# Patient Record
Sex: Female | Born: 1950 | Race: White | Hispanic: No | Marital: Married | Smoking: Former smoker
Health system: Southern US, Community
[De-identification: ages and names within clinical notes are randomized; demographics above are authoritative.]

## PROBLEM LIST (undated history)

## (undated) DIAGNOSIS — R519 Headache, unspecified: Secondary | ICD-10-CM

## (undated) DIAGNOSIS — M1990 Unspecified osteoarthritis, unspecified site: Secondary | ICD-10-CM

## (undated) DIAGNOSIS — R3915 Urgency of urination: Secondary | ICD-10-CM

## (undated) DIAGNOSIS — R51 Headache: Secondary | ICD-10-CM

## (undated) DIAGNOSIS — M810 Age-related osteoporosis without current pathological fracture: Secondary | ICD-10-CM

## (undated) DIAGNOSIS — R251 Tremor, unspecified: Secondary | ICD-10-CM

## (undated) DIAGNOSIS — J309 Allergic rhinitis, unspecified: Secondary | ICD-10-CM

## (undated) HISTORY — PX: NO PAST SURGERIES: SHX2092

---

## 2001-11-13 ENCOUNTER — Encounter: Admission: RE | Admit: 2001-11-13 | Discharge: 2001-11-13 | Payer: Self-pay | Admitting: Emergency Medicine

## 2001-11-13 ENCOUNTER — Encounter: Payer: Self-pay | Admitting: Emergency Medicine

## 2003-01-08 ENCOUNTER — Encounter: Admission: RE | Admit: 2003-01-08 | Discharge: 2003-01-08 | Payer: Self-pay | Admitting: Emergency Medicine

## 2003-03-14 ENCOUNTER — Other Ambulatory Visit: Admission: RE | Admit: 2003-03-14 | Discharge: 2003-03-14 | Payer: Self-pay | Admitting: Emergency Medicine

## 2004-02-19 ENCOUNTER — Encounter: Admission: RE | Admit: 2004-02-19 | Discharge: 2004-02-19 | Payer: Self-pay | Admitting: Emergency Medicine

## 2004-03-15 ENCOUNTER — Other Ambulatory Visit: Admission: RE | Admit: 2004-03-15 | Discharge: 2004-03-15 | Payer: Self-pay | Admitting: Emergency Medicine

## 2005-05-18 ENCOUNTER — Encounter: Admission: RE | Admit: 2005-05-18 | Discharge: 2005-05-18 | Payer: Self-pay | Admitting: Emergency Medicine

## 2005-05-27 ENCOUNTER — Other Ambulatory Visit: Admission: RE | Admit: 2005-05-27 | Discharge: 2005-05-27 | Payer: Self-pay | Admitting: Emergency Medicine

## 2006-05-25 ENCOUNTER — Encounter: Admission: RE | Admit: 2006-05-25 | Discharge: 2006-05-25 | Payer: Self-pay | Admitting: Family Medicine

## 2007-06-01 ENCOUNTER — Encounter: Admission: RE | Admit: 2007-06-01 | Discharge: 2007-06-01 | Payer: Self-pay | Admitting: Family Medicine

## 2008-06-02 ENCOUNTER — Encounter: Admission: RE | Admit: 2008-06-02 | Discharge: 2008-06-02 | Payer: Self-pay | Admitting: Family Medicine

## 2009-05-31 ENCOUNTER — Emergency Department (HOSPITAL_COMMUNITY): Admission: EM | Admit: 2009-05-31 | Discharge: 2009-05-31 | Payer: Self-pay | Admitting: Emergency Medicine

## 2009-06-23 ENCOUNTER — Encounter: Admission: RE | Admit: 2009-06-23 | Discharge: 2009-06-23 | Payer: Self-pay | Admitting: Family Medicine

## 2011-07-06 ENCOUNTER — Other Ambulatory Visit: Payer: Self-pay | Admitting: Family Medicine

## 2011-07-06 DIAGNOSIS — Z1231 Encounter for screening mammogram for malignant neoplasm of breast: Secondary | ICD-10-CM

## 2011-07-06 DIAGNOSIS — M8580 Other specified disorders of bone density and structure, unspecified site: Secondary | ICD-10-CM

## 2011-07-28 ENCOUNTER — Ambulatory Visit: Payer: Self-pay

## 2011-07-28 ENCOUNTER — Ambulatory Visit
Admission: RE | Admit: 2011-07-28 | Discharge: 2011-07-28 | Disposition: A | Discharge status: Home / Self Care | Payer: 59 | Source: Ambulatory Visit | Attending: Family Medicine | Admitting: Family Medicine

## 2011-07-28 ENCOUNTER — Other Ambulatory Visit: Payer: Self-pay

## 2011-07-28 DIAGNOSIS — M8580 Other specified disorders of bone density and structure, unspecified site: Secondary | ICD-10-CM

## 2012-09-04 ENCOUNTER — Other Ambulatory Visit: Payer: Self-pay | Admitting: Family Medicine

## 2012-09-04 DIAGNOSIS — Z1231 Encounter for screening mammogram for malignant neoplasm of breast: Secondary | ICD-10-CM

## 2012-09-25 ENCOUNTER — Inpatient Hospital Stay: Admission: RE | Admit: 2012-09-25 | Payer: 59 | Source: Ambulatory Visit

## 2014-04-28 ENCOUNTER — Encounter: Payer: Self-pay | Admitting: Physical Therapy

## 2014-04-28 ENCOUNTER — Ambulatory Visit: Payer: 59 | Attending: Physician Assistant | Admitting: Physical Therapy

## 2014-04-28 DIAGNOSIS — M5440 Lumbago with sciatica, unspecified side: Secondary | ICD-10-CM

## 2014-04-28 DIAGNOSIS — R29898 Other symptoms and signs involving the musculoskeletal system: Secondary | ICD-10-CM | POA: Diagnosis not present

## 2014-04-28 DIAGNOSIS — R200 Anesthesia of skin: Secondary | ICD-10-CM | POA: Insufficient documentation

## 2014-04-28 NOTE — Therapy (Signed)
[facility] [address], [address] [place], [place], [address] Phone: [phone]   Fax:  [phone]  Physical Therapy Evaluation  Patient Details  Name: Wendy Clark MRN: [id number] Date of Birth: 1950/07/13 Referring Provider:  Jarrett Soho, PA-C  Encounter Date: 04/28/2014      PT End of Session - 04/28/14 1114    Visit Number 1   Date for PT Re-Evaluation 06/09/14   PT Start Time 1100   PT Stop Time 1145   PT Time Calculation (min) 45 min   Activity Tolerance Patient tolerated treatment well   Behavior During Therapy [facility] for tasks assessed/performed      History reviewed. No pertinent past medical history.  History reviewed. No pertinent past surgical history.  There were no vitals filed for this visit.  Visit Diagnosis:  Back pain of lumbar region with sciatica - Plan: PT plan of care cert/re-cert      Subjective Assessment - 04/28/14 1107    Subjective Patient reports back pain that started on 04/07/2014.  Patient reports she was using a garden tool when she hurt her back then sat for several hours. Patient reports pain readiating into left leg into foot.  Patient reports she saw a chiropractor  2 times and not now.  [place] and return on 4/28,.   Limitations Sitting   How long can you sit comfortably? sit for 30 min. and depends on chair and amount of cushion in chair.    How long can you stand comfortably? No difficulty   How long can you walk comfortably? No difficulty   Diagnostic tests x-ray showed lumbar vertebrae were almost fused   Patient Stated Goals learn ways to manage pain and correct body mechanics.    Currently in Pain? Yes   Pain Score 4   low pain 1/10   Pain Location Back   Pain Orientation Left   Pain Type Chronic pain   Pain Radiating Towards down left leg   Pain Onset 1 to 4 weeks ago   Pain Frequency Constant   Aggravating Factors  sitting, sleeping   Pain Relieving Factors ice, change body position   Effect of Pain on Daily Activities difficulty with gardening   Multiple Pain Sites Yes   Pain Location Foot   Pain Orientation Left   Pain Descriptors / Indicators Numbness   Pain Type Chronic pain   Pain Onset 1 to 4 weeks ago   Pain Frequency Intermittent   Aggravating Factors  Not sure            [facility] PT Assessment - 04/28/14 0001    Assessment   Medical Diagnosis low back pain   Onset Date 04/07/14   Prior Therapy chiropractor   Precautions   Precautions None   Balance Screen   Has the patient fallen in the past 6 months Yes  while doing yoga   How many times? 1   Has the patient had a decrease in activity level because of a fear of falling?  No   Is the patient reluctant to leave their home because of a fear of falling?  No   Prior Function   Level of Independence Independent with basic ADLs   Observation/Other Assessments   Focus on Therapeutic Outcomes (FOTO)  41% limitation   Posture/Postural Control   Posture/Postural Control Postural limitations   Postural Limitations Rounded Shoulders;Forward head;Decreased lumbar lordosis   ROM /  Strength   AROM / PROM / Strength Strength   AROM   Lumbar Flexion full   Lumbar Extension decreased by 50%   Lumbar - Right Side Bend full   Lumbar - Left Side Bend full   Strength   Left Hip Flexion 4/5   Left Hip Extension 4/5   Left Hip ABduction 3/5   Left Knee Flexion 4/5   Left Knee Extension 4/5   Left Ankle Dorsiflexion 4/5   Left Ankle Eversion 4/5   Flexibility   Hamstrings tight   Piriformis tight   Obturator Internus tight   Palpation   Palpation palpable tenderness located in left lumbar paraspinal, left piriformis, left hamstring   Straight Leg Raise   Findings Negative   Side  Left                           PT Education - 04/28/14 1138    Education provided Yes   Education Details body  mechanics with daily tasks   Person(s) Educated Patient   Methods Explanation;Verbal cues;Handout   Comprehension Verbalized understanding          PT Short Term Goals - 04/28/14 1128    PT SHORT TERM GOAL #1   Title undertand correct body mechanics for traveling, lifting, sitting and standing   Time 3   Period Weeks   Status New   PT SHORT TERM GOAL #2   Title understand initial HEP to assist in pain management for the trip.    Time 3   Period Weeks   Status New           PT Long Term Goals - 04/28/14 1130    PT LONG TERM GOAL #1   Title sit comfortable to 1 hour with pain decreased >/= 75%   Time 6   Period Weeks   Status New   PT LONG TERM GOAL #2   Title increase strength in left leg so she can tolerate her daily tasks with greater ease   Time 6   Period Weeks   Status New   PT LONG TERM GOAL #3   Title understand correct body mechaincs with gardening and ablility to perform with >/= 65% greater ease   Time 6   Period Weeks   Status New               Plan - 04/28/14 1138    Clinical Impression Statement Patient has back pain that radiates into the left lower extremity.  Patient has numbness in right foot. Patient has weakness in left lower extremity and trunk.    Pt will benefit from skilled therapeutic intervention in order to improve on the following deficits Improper body mechanics;Decreased endurance;Decreased activity tolerance;Increased muscle spasms;Pain;Decreased mobility;Decreased strength   Rehab Potential Excellent   PT Frequency 3x / week   PT Duration 6 weeks  Patient will be out of town from 4/18-4/28   PT Treatment/Interventions ADLs/Self Care Home Management;Moist Heat;Therapeutic activities;Patient/family education;Therapeutic exercise;Traction;Ultrasound;Manual techniques;Neuromuscular re-education;Cryotherapy;Electrical Stimulation;Functional mobility training   PT Next Visit Plan back stabilization exercises, modalities as needed,  soft tissue work in left piriformis, body mechanics   PT Home Exercise Plan body mechainics for lifting   Recommended Other Services None   Consulted and Agree with Plan of Care Patient         Problem List There are no active problems to display for this patient.   GRAY,CHERYL,PT 04/28/2014, 11:46 AM  [facility] [address] [place], [place], [address] Phone: [phone]   Fax:  [phone]

## 2014-04-28 NOTE — Patient Instructions (Signed)
Posture - Sitting   Sit upright, head facing forward. Try using a roll to support lower back. Keep shoulders relaxed, and avoid rounded back. Keep hips level with knees. Avoid crossing legs for long periods.   Copyright  VHI. All rights reserved.  Standing   For prolonged standing, alternate placing one foot in front of the other or on a stool. Wear low-heeled shoes, and maintain good posture.   Copyright  VHI. All rights reserved.  Sleeping on Back   Place pillow under knees. A pillow with cervical support and a roll around waist are also helpful.   Copyright  VHI. All rights reserved.  Lifting Principles .Maintain proper posture and head alignment. .Slide object as close as possible before lifting. .Move obstacles out of the way. .Test before lifting; ask for help if too heavy. .Tighten stomach muscles without holding breath. .Use smooth movements; do not jerk. .Use legs to do the work, and pivot with feet. .Distribute the work load symmetrically and close to the center of trunk. .Push instead of pull whenever possible.

## 2014-04-30 ENCOUNTER — Encounter: Payer: Self-pay | Admitting: Physical Therapy

## 2014-04-30 ENCOUNTER — Ambulatory Visit: Payer: 59 | Admitting: Physical Therapy

## 2014-04-30 DIAGNOSIS — M5440 Lumbago with sciatica, unspecified side: Secondary | ICD-10-CM

## 2014-04-30 NOTE — Therapy (Signed)
[facility] [address], [address] [place], [place], [address] Phone: [phone]   Fax:  [phone]  Physical Therapy Treatment  Patient Details  Name: Wendy Clark MRN: [id number] Date of Birth: 07/29/1950 Referring Provider:  Marcelo Baldy, PA-C  Encounter Date: 04/30/2014      PT End of Session - 04/30/14 1516    Visit Number 2   Date for PT Re-Evaluation 06/09/14   PT Start Time 1445   PT Stop Time 1530   PT Time Calculation (min) 45 min   Activity Tolerance Patient tolerated treatment well   Behavior During Therapy [facility] for tasks assessed/performed      History reviewed. No pertinent past medical history.  History reviewed. No pertinent past surgical history.  There were no vitals filed for this visit.  Visit Diagnosis:  Back pain of lumbar region with sciatica      Subjective Assessment - 04/30/14 1450    Subjective No changes after the evaluation.    Limitations Sitting   How long can you sit comfortably? sit for 30 min. and depends on chair and amount of cushion in chair.    How long can you stand comfortably? No difficulty   How long can you walk comfortably? No difficulty   Diagnostic tests x-ray showed lumbar vertebrae were almost fused   Patient Stated Goals learn ways to manage pain and correct body mechanics.    Currently in Pain? Yes   Pain Score 3    Pain Location Back   Pain Orientation Left   Pain Descriptors / Indicators Aching   Pain Type Chronic pain   Pain Radiating Towards down left buttock and left leg   Pain Onset 1 to 4 weeks ago   Pain Frequency Constant   Aggravating Factors  sitting and sleeping   Pain Relieving Factors ice, change body position   Effect of Pain on Daily Activities difficutly wiht gardening   Multiple Pain Sites No                       [facility] Adult PT Treatment/Exercise - 04/30/14 0001    Modalities   Modalities Ultrasound   Ultrasound    Ultrasound Location Left buttocks   Ultrasound Parameters 100%, 1 mhz, 1.2 w/cm2, 8 min   Ultrasound Goals Pain   Manual Therapy   Manual Therapy Massage   Massage Deep soft tissue work to left gluteal, piriformis, and SI area                PT Education - 04/30/14 1524    Education provided Yes   Education Details piriformis stretch, how to use tennis ball for trigger point massage to left gluteal, neural tension stretch with slumped spine   Person(s) Educated Patient   Methods Explanation;Demonstration;Tactile cues;Verbal cues;Handout   Comprehension Returned demonstration;Verbalized understanding          PT Short Term Goals - 04/28/14 1128    PT SHORT TERM GOAL #1   Title undertand correct body mechanics for traveling, lifting, sitting and standing   Time 3   Period Weeks   Status New   PT SHORT TERM GOAL #2   Title understand initial HEP to assist in pain management for the trip.    Time 3   Period Weeks   Status New           PT Long Term Goals - 04/28/14 1130    PT LONG TERM GOAL #  1   Title sit comfortable to 1 hour with pain decreased >/= 75%   Time 6   Period Weeks   Status New   PT LONG TERM GOAL #2   Title increase strength in left leg so she can tolerate her daily tasks with greater ease   Time 6   Period Weeks   Status New   PT LONG TERM GOAL #3   Title understand correct body mechaincs with gardening and ablility to perform with >/= 65% greater ease   Time 6   Period Weeks   Status New               Plan - 04/30/14 1522    Clinical Impression Statement Patient has muscle spasm in left gluteal and piriformis.     Pt will benefit from skilled therapeutic intervention in order to improve on the following deficits Improper body mechanics;Decreased endurance;Decreased activity tolerance;Increased muscle spasms;Pain;Decreased mobility;Decreased strength   Rehab Potential Excellent   PT Frequency 3x / week   PT Duration 6 weeks   Patient will be out of town for 10 days   PT Treatment/Interventions ADLs/Self Care Home Management;Moist Heat;Therapeutic activities;Patient/family education;Therapeutic exercise;Traction;Ultrasound;Manual techniques;Neuromuscular re-education;Cryotherapy;Electrical Stimulation;Functional mobility training   PT Next Visit Plan Continue with ultrasound to left buttocks, soft tissue work to left buttocks, neural tension to left lower extremity   PT Home Exercise Plan body mechainics for lifting   Consulted and Agree with Plan of Care Patient        Problem List There are no active problems to display for this patient.   GRAY,CHERYL,PT 04/30/2014, 3:26 PM  [facility] [address], [address] [place], [place], [address] Phone: [phone]   Fax:  [phone]

## 2014-04-30 NOTE — Patient Instructions (Addendum)
Piriformis Stretch, Sitting   Sit, one ankle on opposite knee, same-side hand on crossed knee. Push down on knee, keeping spine straight. Lean torso forward, with flat back, until tension is felt in hamstrings and gluteals of crossed-leg side. Hold _30__ seconds.  Repeat _1__ times per session. Do _2__ sessions per day.  Copyright  VHI. All rights reserved.  Lower Limb Neural Tension (Sitting)   Feet on floor, hands behind back, slump forward, bending neck. Straighten left knee until stretch is felt. Hold _1___ seconds. Keep toe pointed toward head. Relax. Repeat __5__ times per set. Do ___1_ sets per session. Do _2___ sessions per day.  http://orth.exer.us/283   Copyright  VHI. All rights reserved.  Sit on tennis ball and roll it in the left buttocks for 1 min.   Patient able to return demonstration correctly with above exercises.

## 2014-05-02 ENCOUNTER — Ambulatory Visit: Payer: 59 | Admitting: Physical Therapy

## 2014-05-02 ENCOUNTER — Encounter: Payer: Self-pay | Admitting: Physical Therapy

## 2014-05-02 DIAGNOSIS — M5440 Lumbago with sciatica, unspecified side: Secondary | ICD-10-CM

## 2014-05-02 NOTE — Therapy (Signed)
[facility] [address], [address] [place], [place], [address] Phone: [phone]   Fax:  [phone]  Physical Therapy Treatment  Patient Details  Name: Ferman HammingSusan T Hosie MRN: [id number] Date of Birth: 03/10/1950 Referring Provider:  Marcelo BaldyMauney, Jessica S, PA-C  Encounter Date: 05/02/2014      PT End of Session - 05/02/14 1145    Visit Number 3   Date for PT Re-Evaluation 06/09/14   PT Start Time 1102   PT Stop Time 1144   PT Time Calculation (min) 42 min   Activity Tolerance Patient tolerated treatment well   Behavior [facility] Therapy [facility] for tasks assessed/performed      History reviewed. No pertinent past medical history.  History reviewed. No pertinent past surgical history.  There were no vitals filed for this visit.  Visit Diagnosis:  No diagnosis found.      Subjective Assessment - 05/02/14 1106    Subjective Feeling better today   Limitations Sitting   How long can you sit comfortably? sit for 30 min. and depends on chair and amount of cushion in chair.    How long can you stand comfortably? No difficulty   How long can you walk comfortably? No difficulty   Diagnostic tests x-ray showed lumbar vertebrae were almost fused   Patient Stated Goals learn ways to manage pain and correct body mechanics.    Currently in Pain? Yes   Pain Score 3    Pain Location Back   Pain Orientation Left   Pain Descriptors / Indicators Aching   Pain Type Chronic pain   Pain Onset 1 to 4 weeks ago   Pain Frequency Constant   Aggravating Factors  sitting    Pain Relieving Factors ice, change body position   Effect of Pain on Daily Activities limited with gardening   Multiple Pain Sites No                       [facility] Adult PT Treatment/Exercise - 05/02/14 0001    Exercises   Exercises Lumbar   Lumbar Exercises: Stretches   Active Hamstring Stretch 3 reps;20 seconds  each leg using strap   Single Knee to Chest  Stretch 3 reps;20 seconds  with left LE, with overpressure by PTA to incr stretch   Lumbar Exercises: Aerobic   Stationary Bike L1 x 6 min   Lumbar Exercises: Supine   Other Supine Lumbar Exercises Figure 4 stretch x 1 each side 20 sec hold, pt knows to perform also insitting   Other Supine Lumbar Exercises Neural stretch: Lt LE Hamstring stretch with ankle into DF 2 x 10 with A by PTA   Modalities   Modalities Ultrasound   Ultrasound   Ultrasound Location left butt   Ultrasound Parameters 100%, 1MHz, 1.2W/cm2 x 6 min   Ultrasound Goals Pain   Manual Therapy   Manual Therapy Myofascial release   Massage Deep tissue work to Lt gluteal area with Lt LE in strtched positions (flexion/add)                   PT Short Term Goals - 04/28/14 1128    PT SHORT TERM GOAL #1   Title undertand correct body mechanics for traveling, lifting, sitting and standing   Time 3   Period Weeks   Status New   PT SHORT TERM GOAL #2   Title understand initial HEP to assist in pain management for the trip.  Time 3   Period Weeks   Status New           PT Long Term Goals - 04/28/14 1130    PT LONG TERM GOAL #1   Title sit comfortable to 1 hour with pain decreased >/= 75%   Time 6   Period Weeks   Status New   PT LONG TERM GOAL #2   Title increase strength in left leg so she can tolerate her daily tasks with greater ease   Time 6   Period Weeks   Status New   PT LONG TERM GOAL #3   Title understand correct body mechaincs with gardening and ablility to perform with >/= 65% greater ease   Time 6   Period Weeks   Status New               Plan - 05/02/14 1159    Clinical Impression Statement Pt with tightness and m spasm in left gluteal and piriformis   Pt will benefit from skilled therapeutic intervention in order to improve on the following deficits Improper body mechanics;Decreased endurance;Decreased activity tolerance;Increased muscle spasms;Pain;Decreased  mobility;Decreased strength   Rehab Potential Excellent   PT Frequency 3x / week   PT Duration 6 weeks   PT Next Visit Plan Continue with ultrasound to left buttocks, soft tissue work to left buttocks, neural tension to left lower extremity   PT Home Exercise Plan body mechanics for lifting   Recommended Other Services none   Consulted and Agree with Plan of Care Patient        Problem List There are no active problems to display for this patient.   NAUMANN-HOUEGNIFIO,Dyron Kawano PTA 05/02/2014, 12:01 PM  [facility] [address] [place], [place], [address] Phone: [phone]   Fax:  [phone]

## 2014-05-05 ENCOUNTER — Encounter: Payer: Self-pay | Admitting: Physical Therapy

## 2014-05-05 ENCOUNTER — Ambulatory Visit: Payer: 59 | Admitting: Physical Therapy

## 2014-05-05 DIAGNOSIS — M5440 Lumbago with sciatica, unspecified side: Secondary | ICD-10-CM | POA: Diagnosis not present

## 2014-05-05 NOTE — Therapy (Signed)
[facility] [address], [address] [place], [place], [address] Phone: [phone]   Fax:  [phone]  Physical Therapy Treatment  Patient Details  Name: Wendy Clark MRN: [id number] Date of Birth: 10-May-1950 Referring Provider:  Marcelo Baldy, PA-C  Encounter Date: 05/05/2014      PT End of Session - 05/05/14 1313    Visit Number 4   Date for PT Re-Evaluation 06/09/14   PT Start Time 1231   PT Stop Time 1315   PT Time Calculation (min) 44 min   Activity Tolerance Patient tolerated treatment well   Behavior During Therapy [facility] for tasks assessed/performed      History reviewed. No pertinent past medical history.  History reviewed. No pertinent past surgical history.  There were no vitals filed for this visit.  Visit Diagnosis:  Back pain of lumbar region with sciatica      Subjective Assessment - 05/05/14 1235    Subjective Only minimal pain, traveling to [place] tomorrow.    Currently in Pain? Yes   Pain Score 2    Pain Location Back   Pain Orientation Proximal;Right;Left   Pain Descriptors / Indicators Aching   Pain Type Chronic pain   Aggravating Factors  sitting   Pain Relieving Factors changing positions   Multiple Pain Sites No                         [facility] Adult PT Treatment/Exercise - 05/05/14 0001    Lumbar Exercises: Stretches   Single Knee to Chest Stretch 3 reps;20 seconds   Single Knee to Chest Stretch Limitations Opposite leg straight    Piriformis Stretch 3 reps;20 seconds   Lumbar Exercises: Aerobic   Stationary Bike L1 x 6 min   Ultrasound   Ultrasound Location LT lumbar   Ultrasound Parameters 100%, 1.3wtcm2,    Ultrasound Goals Pain                PT Education - 05/05/14 1250    Education Details Lumbar protective body mechanics when traveling   Person(s) Educated Patient   Methods Explanation;Tactile cues;Verbal cues;Demonstration   Comprehension Verbalized understanding          PT Short Term Goals - 05/05/14 1248    PT SHORT TERM GOAL #1   Title undertand correct body mechanics for traveling, lifting, sitting and standing   Time 3   Period Weeks   Status Achieved   PT SHORT TERM GOAL #2   Title understand initial HEP to assist in pain management for the trip.    Time 3   Period Weeks   Status Achieved           PT Long Term Goals - 04/28/14 1130    PT LONG TERM GOAL #1   Title sit comfortable to 1 hour with pain decreased >/= 75%   Time 6   Period Weeks   Status New   PT LONG TERM GOAL #2   Title increase strength in left leg so she can tolerate her daily tasks with greater ease   Time 6   Period Weeks   Status New   PT LONG TERM GOAL #3   Title understand correct body mechaincs with gardening and ablility to perform with >/= 65% greater ease   Time 6   Period Weeks   Status New               Plan -  05/05/14 1314    Clinical Impression Statement Patient doing well keeping pain levels low during ADLS.   Pt will benefit from skilled therapeutic intervention in order to improve on the following deficits Improper body mechanics;Decreased endurance;Decreased activity tolerance;Increased muscle spasms;Pain;Decreased mobility;Decreased strength   Rehab Potential Excellent   PT Frequency 3x / week   PT Duration 6 weeks   PT Treatment/Interventions ADLs/Self Care Home Management;Moist Heat;Therapeutic activities;Patient/family education;Therapeutic exercise;Traction;Ultrasound;Manual techniques;Neuromuscular re-education;Cryotherapy;Electrical Stimulation;Functional mobility training   PT Next Visit Plan See how she felt after her trip.   Consulted and Agree with Plan of Care Patient        Problem List There are no active problems to display for this patient.   COCHRAN,JENNIFER, PTA 05/05/2014, 1:20 PM  [facility] [address], [address] [place], [place], [address] Phone: [phone]   Fax:  [phone]

## 2014-05-16 ENCOUNTER — Encounter: Payer: Self-pay | Admitting: Physical Therapy

## 2014-05-16 ENCOUNTER — Ambulatory Visit: Payer: 59 | Admitting: Physical Therapy

## 2014-05-16 DIAGNOSIS — M5440 Lumbago with sciatica, unspecified side: Secondary | ICD-10-CM

## 2014-05-16 NOTE — Therapy (Signed)
[facility] [address], [address] [place], [place], [address] Phone: [phone]   Fax:  [phone]  Physical Therapy Treatment  Patient Details  Name: Wendy Clark MRN: [id number] Date of Birth: 08/17/1950 Referring Provider:  Marcelo BaldyMauney, Jessica S, PA-C  Encounter Date: 05/16/2014      PT End of Session - 05/16/14 1153    Visit Number 5   Date for PT Re-Evaluation 06/09/14   PT Start Time 1100   PT Stop Time 1144   PT Time Calculation (min) 44 min   Activity Tolerance Patient tolerated treatment well   Behavior During Therapy [facility] for tasks assessed/performed      History reviewed. No pertinent past medical history.  History reviewed. No pertinent past surgical history.  There were no vitals filed for this visit.  Visit Diagnosis:  Back pain of lumbar region with sciatica      Subjective Assessment - 05/16/14 1106    Subjective Pt reports it is getting better with low back, but Left foot still feels diconnected   Limitations Sitting   How long can you sit comfortably? sit for 30 min. and depends on chair and amount of cushion in chair.    How long can you stand comfortably? No difficulty   How long can you walk comfortably? No difficulty   Diagnostic tests x-ray showed lumbar vertebrae were almost fused   Patient Stated Goals learn ways to manage pain and correct body mechanics.    Currently in Pain? No/denies   Multiple Pain Sites No            [facility] PT Assessment - 05/16/14 0001    Assessment   Medical Diagnosis low back pain   Onset Date 04/07/14   Prior Therapy chiropractor   Precautions   Precautions None   Balance Screen   Has the patient fallen in the past 6 months Yes   How many times? 1   Has the patient had a decrease in activity level because of a fear of falling?  No   Is the patient reluctant to leave their home because of a fear of falling?  No   Prior Function   Level of Independence  Independent with basic ADLs   Posture/Postural Control   Posture/Postural Control Postural limitations   Postural Limitations Rounded Shoulders;Forward head;Decreased lumbar lordosis   ROM / Strength   AROM / PROM / Strength Strength   AROM   Lumbar Flexion full   Lumbar Extension decreased by 50%   Lumbar - Right Side Bend full   Lumbar - Left Side Bend full                     [facility] Adult PT Treatment/Exercise - 05/16/14 0001    Lumbar Exercises: Stretches   Active Hamstring Stretch 3 reps;20 seconds  on stairs, advised to perform when traveling long distance   Single Knee to Chest Stretch 3 reps;20 seconds   Single Knee to Chest Stretch Limitations Opposite leg straight    Piriformis Stretch 3 reps;20 seconds  in sitting and x 1 each side in supine   Lumbar Exercises: Aerobic   Stationary Bike L1 x 8 min   Modalities   Modalities Ultrasound   Ultrasound   Ultrasound Location Left butocks area in Rt sidelying   Ultrasound Parameters 100%, 1 MHz, 1W/cm   Ultrasound Goals Pain                PT  Education - 05/16/14 1152    Education provided Yes   Education Details educated on spinal model and posture aout anatomy for better understanding of mimportance of compliance with HEP   Person(s) Educated Patient   Methods Explanation;Tactile cues   Comprehension Verbalized understanding          PT Short Term Goals - 05/16/14 1156    PT SHORT TERM GOAL #1   Title undertand correct body mechanics for traveling, lifting, sitting and standing   Time 3   Period Weeks   Status Achieved   PT SHORT TERM GOAL #2   Title understand initial HEP to assist in pain management for the trip.    Time 3   Period Weeks   Status Achieved           PT Long Term Goals - 05/16/14 1156    PT LONG TERM GOAL #1   Title sit comfortable to 1 hour with pain decreased >/= 75%   Time 6   Period Weeks   Status Achieved   PT LONG TERM GOAL #2   Title increase strength  in left leg so she can tolerate her daily tasks with greater ease   Time 6   Period Weeks   Status Achieved   PT LONG TERM GOAL #3   Title understand correct body mechaincs with gardening and ablility to perform with >/= 65% greater ease   Time 6   Period Weeks   Status Achieved               Plan - 05/16/14 1153    Clinical Impression Statement Patient with good demonstration of stretching exercises, and importance of compliance with proper bodymechanics   Pt will benefit from skilled therapeutic intervention in order to improve on the following deficits Improper body mechanics;Decreased endurance;Decreased activity tolerance;Increased muscle spasms;Pain;Decreased mobility;Decreased strength   Rehab Potential Excellent   PT Frequency 3x / week   PT Duration 6 weeks   PT Treatment/Interventions ADLs/Self Care Home Management;Moist Heat;Therapeutic activities;Patient/family education;Therapeutic exercise;Traction;Ultrasound;Manual techniques;Neuromuscular re-education;Cryotherapy;Electrical Stimulation;Functional mobility training   PT Next Visit Plan Pt wishes to be D/C she feels great in low back, except that decreased control in left lower extremity. She has to travel to her 39 year old mother in [place]    PT Home Exercise Plan current HEP   Consulted and Agree with Plan of Care Patient        Problem List There are no active problems to display for this patient.   NAUMANN-HOUEGNIFIO,Celita Aron PTA 05/16/2014, 12:03 PM  [facility] [address], [address] [place], [place], [address] Phone: [phone]   Fax:  [phone]

## 2014-05-19 ENCOUNTER — Encounter: Payer: 59 | Admitting: Physical Therapy

## 2014-05-20 NOTE — Therapy (Signed)
[facility] [address] [place], [place], [address] Phone: [phone]   Fax:  [phone]  Patient Details  Name: Wendy Clark MRN: [id number] Date of Birth: 30-Mar-1950 Referring Provider:  Michel Harrow, PA-C  Encounter Date: 05/16/2014 PHYSICAL THERAPY DISCHARGE SUMMARY  Visits from Start of Care: 5 Current functional level related to goals / functional outcomes: Patient has met all of her goals.  Patient able to sit longer.  Patient has increased strength of left lower extremity.    Remaining deficits: None   Education / Equipment: HEP Plan: Patient agrees to discharge.  Patient goals were met. Patient is being discharged due to meeting the stated rehab goals. thank you for the referral.  Earlie Counts, PT 05/20/2014 1:53 PM  ?????      GRAY,CHERYL,PT 05/20/2014, 1:53 PM  [facility] [address], [place], [place], [address] Phone: [phone]   Fax:  [phone]

## 2014-05-21 ENCOUNTER — Encounter: Payer: 59 | Admitting: Physical Therapy

## 2014-05-26 ENCOUNTER — Encounter: Payer: 59 | Admitting: Physical Therapy

## 2014-05-28 ENCOUNTER — Encounter: Payer: 59 | Admitting: Physical Therapy

## 2014-05-28 ENCOUNTER — Ambulatory Visit: Payer: 59 | Admitting: Physical Therapy

## 2014-06-02 ENCOUNTER — Encounter: Payer: 59 | Admitting: Physical Therapy

## 2014-06-04 ENCOUNTER — Encounter: Payer: 59 | Admitting: Physical Therapy

## 2014-06-09 ENCOUNTER — Encounter: Payer: 59 | Admitting: Physical Therapy

## 2014-06-11 ENCOUNTER — Encounter: Payer: 59 | Admitting: Physical Therapy

## 2014-09-15 ENCOUNTER — Other Ambulatory Visit: Payer: Self-pay | Admitting: Family Medicine

## 2014-09-15 ENCOUNTER — Other Ambulatory Visit (HOSPITAL_COMMUNITY)
Admission: RE | Admit: 2014-09-15 | Discharge: 2014-09-15 | Disposition: A | Discharge status: Home / Self Care | Payer: 59 | Source: Ambulatory Visit | Attending: Family Medicine | Admitting: Family Medicine

## 2014-09-15 DIAGNOSIS — Z01419 Encounter for gynecological examination (general) (routine) without abnormal findings: Secondary | ICD-10-CM | POA: Diagnosis present

## 2014-09-15 DIAGNOSIS — Z1151 Encounter for screening for human papillomavirus (HPV): Secondary | ICD-10-CM | POA: Insufficient documentation

## 2014-09-16 LAB — CYTOLOGY - PAP

## 2014-09-23 ENCOUNTER — Other Ambulatory Visit: Payer: Self-pay | Admitting: Family Medicine

## 2014-09-23 DIAGNOSIS — M8580 Other specified disorders of bone density and structure, unspecified site: Secondary | ICD-10-CM

## 2014-11-05 ENCOUNTER — Inpatient Hospital Stay: Admission: RE | Admit: 2014-11-05 | Payer: 59 | Source: Ambulatory Visit

## 2014-12-05 ENCOUNTER — Ambulatory Visit
Admission: RE | Admit: 2014-12-05 | Discharge: 2014-12-05 | Disposition: A | Discharge status: Home / Self Care | Payer: 59 | Source: Ambulatory Visit | Attending: Family Medicine | Admitting: Family Medicine

## 2014-12-05 DIAGNOSIS — M8580 Other specified disorders of bone density and structure, unspecified site: Secondary | ICD-10-CM

## 2015-04-27 ENCOUNTER — Other Ambulatory Visit: Payer: Self-pay | Admitting: Family Medicine

## 2015-04-27 DIAGNOSIS — M5442 Lumbago with sciatica, left side: Secondary | ICD-10-CM

## 2015-05-02 ENCOUNTER — Ambulatory Visit
Admission: RE | Admit: 2015-05-02 | Discharge: 2015-05-02 | Disposition: A | Discharge status: Home / Self Care | Payer: Self-pay | Source: Ambulatory Visit | Attending: Family Medicine | Admitting: Family Medicine

## 2015-05-02 DIAGNOSIS — M5442 Lumbago with sciatica, left side: Secondary | ICD-10-CM

## 2015-09-18 ENCOUNTER — Other Ambulatory Visit: Payer: Self-pay | Admitting: Emergency Medicine

## 2015-09-18 DIAGNOSIS — Z1231 Encounter for screening mammogram for malignant neoplasm of breast: Secondary | ICD-10-CM

## 2015-09-30 ENCOUNTER — Ambulatory Visit
Admission: RE | Admit: 2015-09-30 | Discharge: 2015-09-30 | Disposition: A | Discharge status: Home / Self Care | Payer: BLUE CROSS/BLUE SHIELD | Source: Ambulatory Visit | Attending: Emergency Medicine | Admitting: Emergency Medicine

## 2015-09-30 DIAGNOSIS — Z1231 Encounter for screening mammogram for malignant neoplasm of breast: Secondary | ICD-10-CM

## 2016-10-13 ENCOUNTER — Other Ambulatory Visit: Payer: Self-pay | Admitting: Family Medicine

## 2016-10-13 DIAGNOSIS — M810 Age-related osteoporosis without current pathological fracture: Secondary | ICD-10-CM

## 2016-10-13 DIAGNOSIS — Z1231 Encounter for screening mammogram for malignant neoplasm of breast: Secondary | ICD-10-CM

## 2016-12-06 ENCOUNTER — Ambulatory Visit
Admission: RE | Admit: 2016-12-06 | Discharge: 2016-12-06 | Disposition: A | Discharge status: Home / Self Care | Payer: Medicare Other | Source: Ambulatory Visit | Attending: Family Medicine | Admitting: Family Medicine

## 2016-12-06 DIAGNOSIS — Z1231 Encounter for screening mammogram for malignant neoplasm of breast: Secondary | ICD-10-CM

## 2016-12-06 DIAGNOSIS — M810 Age-related osteoporosis without current pathological fracture: Secondary | ICD-10-CM | POA: Diagnosis not present

## 2016-12-06 DIAGNOSIS — Z780 Asymptomatic menopausal state: Secondary | ICD-10-CM | POA: Diagnosis not present

## 2016-12-21 DIAGNOSIS — H6502 Acute serous otitis media, left ear: Secondary | ICD-10-CM | POA: Diagnosis not present

## 2016-12-21 DIAGNOSIS — J029 Acute pharyngitis, unspecified: Secondary | ICD-10-CM | POA: Diagnosis not present

## 2016-12-21 DIAGNOSIS — R51 Headache: Secondary | ICD-10-CM | POA: Diagnosis not present

## 2016-12-27 DIAGNOSIS — E663 Overweight: Secondary | ICD-10-CM | POA: Diagnosis not present

## 2016-12-27 DIAGNOSIS — M2550 Pain in unspecified joint: Secondary | ICD-10-CM | POA: Diagnosis not present

## 2016-12-27 DIAGNOSIS — Z6829 Body mass index (BMI) 29.0-29.9, adult: Secondary | ICD-10-CM | POA: Diagnosis not present

## 2016-12-27 DIAGNOSIS — M150 Primary generalized (osteo)arthritis: Secondary | ICD-10-CM | POA: Diagnosis not present

## 2017-02-07 DIAGNOSIS — L608 Other nail disorders: Secondary | ICD-10-CM | POA: Diagnosis not present

## 2017-02-07 DIAGNOSIS — R251 Tremor, unspecified: Secondary | ICD-10-CM | POA: Diagnosis not present

## 2017-02-22 ENCOUNTER — Other Ambulatory Visit: Payer: Self-pay | Admitting: Podiatry

## 2017-02-22 ENCOUNTER — Encounter: Payer: Self-pay | Admitting: Podiatry

## 2017-02-22 ENCOUNTER — Ambulatory Visit (INDEPENDENT_AMBULATORY_CARE_PROVIDER_SITE_OTHER): Payer: Medicare Other | Admitting: Podiatry

## 2017-02-22 ENCOUNTER — Ambulatory Visit (INDEPENDENT_AMBULATORY_CARE_PROVIDER_SITE_OTHER): Payer: Medicare Other

## 2017-02-22 VITALS — BP 101/57 | HR 80 | Resp 16

## 2017-02-22 DIAGNOSIS — L600 Ingrowing nail: Secondary | ICD-10-CM | POA: Diagnosis not present

## 2017-02-22 DIAGNOSIS — M79672 Pain in left foot: Secondary | ICD-10-CM

## 2017-02-22 DIAGNOSIS — M205X9 Other deformities of toe(s) (acquired), unspecified foot: Secondary | ICD-10-CM | POA: Diagnosis not present

## 2017-02-22 DIAGNOSIS — M79671 Pain in right foot: Secondary | ICD-10-CM

## 2017-02-22 DIAGNOSIS — M779 Enthesopathy, unspecified: Secondary | ICD-10-CM | POA: Diagnosis not present

## 2017-02-22 MED ORDER — TRIAMCINOLONE ACETONIDE 10 MG/ML IJ SUSP
10.0000 mg | Freq: Once | INTRAMUSCULAR | Status: AC
  Administered 2017-02-22: 10 mg

## 2017-02-22 NOTE — Progress Notes (Signed)
Subjective:   Patient ID: Wendy Clark, female   DOB: 67 y.o.   MRN: [id number]   HPI Patient presents with chronic ingrown toenail of the right big toe inflammation pain of the big toe joint right small lesion on the right second toe that is flesh-colored and gets irritated by the big toe and irritation of the left big toe joint along with the second digit.  Patient does not smoke and likes to be active and states the ingrown toenail is very tender and Wendy Clark had her left one fixed years ago   Review of Systems  All other systems reviewed and are negative.       Objective:  Physical Exam  Constitutional: Wendy Clark appears well-developed and well-nourished.  Cardiovascular: Intact distal pulses.  Pulmonary/Chest: Effort normal.  Musculoskeletal: Normal range of motion.  Neurological: Wendy Clark is alert.  Skin: Skin is warm.  Nursing note and vitals reviewed.   Neurovascular status intact muscle strength is adequate range of motion within normal limits with patient found to have significant range of motion loss first MPJ right over left with incurvation of the right hallux nail with no redness drainage noted but pain in the corners secondary to structure of the nail bed itself.  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  I went ahead discussed nail correction explaining there is no guarantee that this will correct her problem and the possibility for loss of nail or nail discoloration is present.  I infiltrated the right hallux  60 mg like Marcaine mixture sterile prep was applied and using sterile instrumentation removal of the medial lateral borders was done and after matrix was exposed and phenol applied 3 applications 30 seconds to each border followed by alcohol lavage.  I then went ahead and injected around the big toe joint right 3 mg Kenalog pyelogram Xylocaine and discussed possible correction of one point in future or removal of mole if it remains irritated but did apply dressings and silicone to the second digit bilateral  X-rays indicate that there is hallux limitus deformity bilateral with narrowing of the joint surface and spurs right over left first metatarsal head

## 2017-02-22 NOTE — Progress Notes (Signed)
   Subjective:    Patient ID: Wendy Clark, female    DOB: 09/22/1950, 67 y.o.   MRN: [id number]  HPI    Review of Systems  All other systems reviewed and are negative.      Objective:   Physical Exam        Assessment & Plan:

## 2017-02-22 NOTE — Patient Instructions (Addendum)

## 2017-03-13 ENCOUNTER — Ambulatory Visit (INDEPENDENT_AMBULATORY_CARE_PROVIDER_SITE_OTHER): Payer: Medicare Other | Admitting: Neurology

## 2017-03-13 ENCOUNTER — Encounter: Payer: Self-pay | Admitting: Neurology

## 2017-03-13 VITALS — BP 103/64 | HR 64 | Ht 63.0 in | Wt 166.0 lb

## 2017-03-13 DIAGNOSIS — G250 Essential tremor: Secondary | ICD-10-CM

## 2017-03-13 NOTE — Patient Instructions (Addendum)
  Please remember, that any kind of tremor may be exacerbated by anxiety, anger, nervousness, excitement, dehydration, sleep deprivation, by caffeine, and low blood sugar values or blood sugar fluctuations. Some medications, especially some antidepressants and lithium can cause or exacerbate tremors. Tremors may temporarily calm down or subside with the use of a benzodiazepine such as Valium or related medications and with alcohol. Be aware, however, that drinking alcohol is not an approved or appropriate treatment for tremor control and long-term use of benzodiazepines such as Valium, lorazepam, alprazolam, or clonazepam can cause habit formation, physical and psychological addiction. There are very few medications that symptomatically help with tremor reduction, none are without potential side effects.   We will hold off on any test or medication, as your tremor is mild and there is no other focal abnormality on your exam. No signs of parkinsonism, thankfully.   Please make sure, that you have your thyroid function tested with your next blood work with Ms. Wharton.   I would recommend, you consider seeing a spine specialist.  Also, you may want to see orthopedics again for your left knee.   I can see you back as needed.

## 2017-03-13 NOTE — Progress Notes (Signed)
Subjective:    Patient ID: Ferman HammingSusan T Tayloe is a 67 y.o. female.  HPI     Huston FoleySaima Jese Comella, MD, PhD [facility] [address], [address] [address] [place], [place] [address]  Dear Toni Amendourtney,  I saw your patient, Trudie BucklerSusan Kilfoyle, upon your kind request in my neurologic clinic today for initial consultation of her tremor. The patient is unaccompanied today. As you know, Ms. Sharol HarnessSimmons is a 67 year old right-handed woman with an underlying medical history of allergic rhinitis, low back pain, prior smoking, vitamin D deficiency, restless leg syndrome arthritis, who has had a bilateral upper extremity tremor for years. She feels that this has become worse in the past several months. I reviewed your office note from 02/07/2017, which you kindly included. She quit smoking. She drinks alcohol socially, 2-3 times per month on average. She has not noticed any impact on her tremor with regards to drinking alcohol. She drinks caffeine in the form of hot tea one cup per day and 3 cups of iced tea per day on average. After about 1 PM she drinks decaf tea. She does report a family history of tremor, one of her brothers has a tremor. She has 3 brothers. She believes that her father may have had a hand tremor. She has been on Prozac low-dose for over 20 years, presumably for restless leg syndrome and leg movements at night. She reports that when she tried to reduce her Prozac dose or try to come off of it in the past her leg movements became worse. She tries to hydrate well. She exercises regularly and has noticed a temporary flareup in her tremors after strenuous exercise such as weight lifting. She has a history of low back pain and had an MRI for this in 2017 which I reviewed. She has not seen a spine specialist. She did see orthopedics for her left knee in the past, probably 2 or 3 years ago.  Her Past Medical History Is Significant For: No past medical history on file.  Her Past Surgical History  Is Significant For: No past surgical history on file.  Her Family History Is Significant For: No family history on file.  Her Social History Is Significant For: Social History   Socioeconomic History  . Marital status: Married    Spouse name: None  . Number of children: None  . Years of education: None  . Highest education level: None  Social Needs  . Financial resource strain: None  . Food insecurity - worry: None  . Food insecurity - inability: None  . Transportation needs - medical: None  . Transportation needs - non-medical: None  Occupational History  . None  Tobacco Use  . Smoking status: Former Games developermoker  . Smokeless tobacco: Never Used  Substance and Sexual Activity  . Alcohol use: None  . Drug use: None  . Sexual activity: None  Other Topics Concern  . None  Social History Narrative  . None    Her Allergies Are:  Allergies  Allergen Reactions  . Other     Novicaine - pt stated, "I get faint, pass out, light-headed"  :   Her Current Medications Are:  Outpatient Encounter Medications as of 03/13/2017  Medication Sig  . Calcium Citrate (CITRACAL PO) Take by mouth.  . Cholecalciferol (VITAMIN D PO) Take by mouth.  Marland Kitchen. FLUoxetine (PROZAC) 20 MG capsule Take 20 mg by mouth daily.  Marland Kitchen. loratadine (CLARITIN) 10 MG tablet Take 10 mg by mouth daily.  . Omega-3  Fatty Acids (FISH OIL) 1200 MG CAPS Take 1 capsule by mouth 2 (two) times daily.  . raloxifene (EVISTA) 60 MG tablet TK 1 T PO QD  . vitamin B-12 (CYANOCOBALAMIN) 100 MCG tablet Take 100 mcg by mouth daily.  . [DISCONTINUED] calcium citrate-vitamin D (CITRACAL+D) 315-200 MG-UNIT tablet Take 1 tablet by mouth 2 (two) times daily.   No facility-administered encounter medications on file as of 03/13/2017.   :   Review of Systems:  Out of a complete 14 point review of systems, all are reviewed and negative with the exception of these symptoms as listed below:  Review of Systems  Neurological:       Pt presents  today to discuss her bilateral hand tremors. Pt reports that she has had bilateral hand tremors since she was young but over the past 6 months the tremors seemed to have worsened. Pt also wants to discuss L foot toe numbness.    Objective:  Neurological Exam  Physical Exam Physical Examination:   Vitals:   03/13/17 0958  BP: 103/64  Pulse: 64    General Examination: The patient is a very pleasant 67 y.o. female in no acute distress. She appears well-developed and well-nourished and well groomed.   HEENT: Normocephalic, atraumatic, pupils are equal, round and reactive to light and accommodation. She wears corrective eyeglasses. Neck is supple with full range of motion. She has an intermittent slight head tremor, no voice tremor. No dysarthria, no hypophonia. Extraocular tracking is good without limitation to gaze excursion or nystagmus noted. Normal smooth pursuit is noted. Hearing is grossly intact. Face is symmetric with normal facial animation. Oropharynx exam reveals: benign.    Chest: Clear to auscultation without wheezing, rhonchi or crackles noted.  Heart: S1+S2+0, regular and normal without murmurs, rubs or gallops noted.   Abdomen: Soft, non-tender and non-distended with normal bowel sounds appreciated on auscultation.  Extremities: There is no pitting edema in the distal lower extremities bilaterally. Pedal pulses are intact.  Skin: Warm and dry without trophic changes noted.  Musculoskeletal: exam reveals no obvious joint deformities, tenderness or joint swelling or erythema with the exception of mildly larger left knee joint, mild discomfort in the left knee and genu valgus L knee.    Neurologically:  Mental status: The patient is awake, alert and oriented in all 4 spheres. Her immediate and remote memory, attention, language skills and fund of knowledge are appropriate. There is no evidence of aphasia, agnosia, apraxia or anomia. Speech is clear with normal prosody and  enunciation. Thought process is linear. Mood is normal and affect is normal.  Cranial nerves II - XII are as described above under HEENT exam. In addition: shoulder shrug is normal with equal shoulder height noted. Motor exam: Normal bulk, strength and tone is noted. There is no drift, resting tremor or rebound. Romberg is negative.  On 03/13/2017: On Archimedes spiral drawing she has no significant difficulty with her right, dominant hand, left spiral is showing mild tremulousness him a handwriting is not tremulous, legible, not micrographic.  She has a very mild bilateral upper extremity postural and action tremor, no intention tremor.  Reflexes are 2+ throughout. Fine motor skills and coordination: intact with normal finger taps, normal hand movements, normal rapid alternating patting, normal foot taps and normal foot agility.  Cerebellar testing: No dysmetria or intention tremor on finger to nose testing. Heel to shin is unremarkable bilaterally. There is no truncal or gait ataxia.  Sensory exam: intact to light touch, pinprick,  vibration, temperature sense in the upper and lower extremities.  Gait, station and balance: She stands easily. No veering to one side is noted. No leaning to one side is noted. Posture is age-appropriate and stance is narrow based. Gait shows normal stride length and normal pace. No problems turning are noted.   Assessment and Plan:   In summary, RICKEY FARRIER is a very pleasant 67 y.o.-year old female with an underlying medical history of allergic rhinitis, low back pain, prior smoking, vitamin D deficiency, restless leg syndrome arthritis, who presents for neurologic consultation of her long-standing history of bilateral upper extremity tremors with worsening noticed in the past 6 months or so. Her history and examination are in keeping with essential tremor. Findings are overall in the mild range. She has no history or exam findings concerning for parkinsonism. She is  reassured in that regard. I did not see any obvious focality on exam. She does have a slight head tremor as well. She was not aware of this. She is overall not particularly bothered by the tremor and is not seeking symptomatic treatment for this. I talked to her at length about her condition and likely family history of this as well. We mutually agreed to monitor symptoms and I will see her back on an as-needed basis. She does not need any further testing from my end. I would like to make sure that her TSH was up to date. I did not see any blood work with her referral paperwork today. She is not due for blood work any time soon but is reminded to make sure thyroid function will be tested with her next blood test. We talked about potential tremor triggers as well today. She is advised that caffeine and strenuous exercise can temporarily worsened tremors. She is encouraged to scale back on her caffeine intake and increase her water intake. I would be happy to see her back on an as-needed basis. She reports issues with back pain and left-sided knee pain. She is encouraged to talk to you about seeing a spine specialist sensing her orthopedic doctor again for her left knee. I answered all her questions today and she was in agreement.  Thank you very much for allowing me to participate in the care of this nice patient. If I can be of any further assistance to you please do not hesitate to call me at [phone].  Sincerely,   Huston Foley, MD, PhD

## 2017-03-15 ENCOUNTER — Ambulatory Visit (INDEPENDENT_AMBULATORY_CARE_PROVIDER_SITE_OTHER): Payer: Medicare Other | Admitting: Podiatry

## 2017-03-15 ENCOUNTER — Encounter: Payer: Self-pay | Admitting: Podiatry

## 2017-03-15 DIAGNOSIS — L03031 Cellulitis of right toe: Secondary | ICD-10-CM

## 2017-03-15 NOTE — Progress Notes (Signed)
Subjective:   Patient ID: Wendy HammingSusan T Ware, female   DOB: 67 y.o.   MRN: [id number]   HPI Patient presents with having had ingrown toenails on right hallux and concerned about redness   ROS      Objective:  Physical Exam  Neurovascular status intact with incurvation crusted tissue of the right hallux lateral border that is localized in nature     Assessment:  Paronychia of the right hallux lateral border localized in nature with no proximal edema erythema or drainage noted     Plan:  Sterile debridement of the tissue accomplished with no active drainage and instructed on soaks and apply Neosporin and may place on antibiotics if redness persists or any proximal redness were to occur

## 2017-04-21 DIAGNOSIS — L603 Nail dystrophy: Secondary | ICD-10-CM | POA: Diagnosis not present

## 2017-05-10 DIAGNOSIS — M25562 Pain in left knee: Secondary | ICD-10-CM | POA: Diagnosis not present

## 2017-05-17 DIAGNOSIS — M1712 Unilateral primary osteoarthritis, left knee: Secondary | ICD-10-CM | POA: Diagnosis not present

## 2017-06-22 ENCOUNTER — Other Ambulatory Visit: Payer: Self-pay | Admitting: Orthopedic Surgery

## 2017-07-06 NOTE — Progress Notes (Signed)
LOV NEURO DR. Frances FurbishATHAR 03-13-17 Epic

## 2017-07-06 NOTE — Patient Instructions (Signed)
Wendy HammingSusan T Clark  07/06/2017   Your procedure is scheduled on: 07-17-17   [facility] Main  [facility]    Report to admitting at 9:45AM    Call this number if you have problems the morning of surgery [phone]     Remember: Do not eat food or drink liquids :After Midnight.     Take these medicines the morning of surgery with A SIP OF WATER: fluoxetine, loratadine if needed                                You may not have any metal on your body including hair pins and              piercings  Do not wear jewelry, make-up, lotions, powders or perfumes, deodorant             Do not wear nail polish.  Do not shave  48 hours prior to surgery.            Do not bring valuables to the hospital. [facility] IS NOT             RESPONSIBLE   FOR VALUABLES.  Contacts, dentures or bridgework may not be worn into surgery.  Leave suitcase in the car. After surgery it may be brought to your room.                 Please read over the following fact sheets you were given: _____________________________________________________________________             [facility] - Preparing [facility] Before surgery, you can play an important role.  Because skin is not sterile, your skin needs to be as free of germs as possible.  You can reduce the number of germs on your skin by washing with CHG (chlorahexidine gluconate) soap before surgery.  CHG is an antiseptic cleaner which kills germs and bonds with the skin to continue killing germs even after washing. Please DO NOT use if you have an allergy to CHG or antibacterial soaps.  If your skin becomes reddened/irritated stop using the CHG and inform your nurse when you arrive at Short Stay. Do not shave (including legs and underarms) for at least 48 hours prior to the first CHG shower.  You may shave your face/neck. Please follow these instructions carefully:  1.  Shower with CHG Soap the night before surgery and the  morning  of Surgery.  2.  If you choose to wash your hair, wash your hair first as usual with your  normal  shampoo.  3.  After you shampoo, rinse your hair and body thoroughly to remove the  shampoo.                           4.  Use CHG as you would any other liquid soap.  You can apply chg directly  to the skin and wash                       Gently with a scrungie or clean washcloth.  5.  Apply the CHG Soap to your body ONLY FROM THE NECK DOWN.   Do not use on face/ open  Wound or open sores. Avoid contact with eyes, ears mouth and genitals (private parts).                       Wash face,  Genitals (private parts) with your normal soap.             6.  Wash thoroughly, paying special attention to the area where your surgery  will be performed.  7.  Thoroughly rinse your body with warm water from the neck down.  8.  DO NOT shower/wash with your normal soap after using and rinsing off  the CHG Soap.                9.  Pat yourself dry with a clean towel.            10.  Wear clean pajamas.            11.  Place clean sheets on your bed the night of your first shower and do not  sleep with pets. Day of Surgery : Do not apply any lotions/deodorants the morning of surgery.  Please wear clean clothes to the hospital/surgery center.  FAILURE TO FOLLOW THESE INSTRUCTIONS MAY RESULT IN THE CANCELLATION OF YOUR SURGERY PATIENT SIGNATURE_________________________________  NURSE SIGNATURE__________________________________  ________________________________________________________________________   Adam Phenix  An incentive spirometer is a tool that can help keep your lungs clear and active. This tool measures how well you are filling your lungs with each breath. Taking long deep breaths may help reverse or decrease the chance of developing breathing (pulmonary) problems (especially infection) following:  A long period of time when you are unable to move or be  active. BEFORE THE PROCEDURE   If the spirometer includes an indicator to show your best effort, your nurse or respiratory therapist will set it to a desired goal.  If possible, sit up straight or lean slightly forward. Try not to slouch.  Hold the incentive spirometer in an upright position. INSTRUCTIONS FOR USE  1. Sit on the edge of your bed if possible, or sit up as far as you can in bed or on a chair. 2. Hold the incentive spirometer in an upright position. 3. Breathe out normally. 4. Place the mouthpiece in your mouth and seal your lips tightly around it. 5. Breathe in slowly and as deeply as possible, raising the piston or the ball toward the top of the column. 6. Hold your breath for 3-5 seconds or for as long as possible. Allow the piston or ball to fall to the bottom of the column. 7. Remove the mouthpiece from your mouth and breathe out normally. 8. Rest for a few seconds and repeat Steps 1 through 7 at least 10 times every 1-2 hours when you are awake. Take your time and take a few normal breaths between deep breaths. 9. The spirometer may include an indicator to show your best effort. Use the indicator as a goal to work toward during each repetition. 10. After each set of 10 deep breaths, practice coughing to be sure your lungs are clear. If you have an incision (the cut made at the time of surgery), support your incision when coughing by placing a pillow or rolled up towels firmly against it. Once you are able to get out of bed, walk around indoors and cough well. You may stop using the incentive spirometer when instructed by your caregiver.  RISKS AND COMPLICATIONS  Take your time so you do not get  dizzy or light-headed.  If you are in pain, you may need to take or ask for pain medication before doing incentive spirometry. It is harder to take a deep breath if you are having pain. AFTER USE  Rest and breathe slowly and easily.  It can be helpful to keep track of a log of  your progress. Your caregiver can provide you with a simple table to help with this. If you are using the spirometer at home, follow these instructions: [place] IF:   You are having difficultly using the spirometer.  You have trouble using the spirometer as often as instructed.  Your pain medication is not giving enough relief while using the spirometer.  You develop fever of 100.5 F (38.1 C) or higher. SEEK IMMEDIATE MEDICAL CARE IF:   You cough up bloody sputum that had not been present before.  You develop fever of 102 F (38.9 C) or greater.  You develop worsening pain at or near the incision site. MAKE SURE YOU:   Understand these instructions.  Will watch your condition.  Will get help right away if you are not doing well or get worse. Document Released: 05/16/2006 Document Revised: 03/28/2011 Document Reviewed: 07/17/2006 [facility] Patient Information 2014 [place], [place].   ________________________________________________________________________

## 2017-07-07 ENCOUNTER — Ambulatory Visit (HOSPITAL_COMMUNITY)
Admission: RE | Admit: 2017-07-07 | Discharge: 2017-07-07 | Disposition: A | Discharge status: Home / Self Care | Payer: Medicare Other | Source: Ambulatory Visit | Attending: Orthopedic Surgery | Admitting: Orthopedic Surgery

## 2017-07-07 ENCOUNTER — Encounter (HOSPITAL_COMMUNITY): Payer: Self-pay

## 2017-07-07 ENCOUNTER — Encounter (HOSPITAL_COMMUNITY)
Admission: RE | Admit: 2017-07-07 | Discharge: 2017-07-07 | Disposition: A | Discharge status: Home / Self Care | Payer: Medicare Other | Source: Ambulatory Visit | Attending: Orthopedic Surgery | Admitting: Orthopedic Surgery

## 2017-07-07 ENCOUNTER — Other Ambulatory Visit: Payer: Self-pay

## 2017-07-07 DIAGNOSIS — M1712 Unilateral primary osteoarthritis, left knee: Secondary | ICD-10-CM | POA: Insufficient documentation

## 2017-07-07 DIAGNOSIS — Z01812 Encounter for preprocedural laboratory examination: Secondary | ICD-10-CM | POA: Diagnosis not present

## 2017-07-07 DIAGNOSIS — Z01810 Encounter for preprocedural cardiovascular examination: Secondary | ICD-10-CM | POA: Diagnosis not present

## 2017-07-07 DIAGNOSIS — Z01818 Encounter for other preprocedural examination: Secondary | ICD-10-CM

## 2017-07-07 HISTORY — DX: Allergic rhinitis, unspecified: J30.9

## 2017-07-07 HISTORY — DX: Tremor, unspecified: R25.1

## 2017-07-07 HISTORY — DX: Headache: R51

## 2017-07-07 HISTORY — DX: Headache, unspecified: R51.9

## 2017-07-07 HISTORY — DX: Age-related osteoporosis without current pathological fracture: M81.0

## 2017-07-07 HISTORY — DX: Urgency of urination: R39.15

## 2017-07-07 HISTORY — DX: Unspecified osteoarthritis, unspecified site: M19.90

## 2017-07-07 LAB — URINALYSIS, ROUTINE W REFLEX MICROSCOPIC
BILIRUBIN URINE: NEGATIVE
Glucose, UA: NEGATIVE mg/dL
Hgb urine dipstick: NEGATIVE
Ketones, ur: NEGATIVE mg/dL
Leukocytes, UA: NEGATIVE
NITRITE: NEGATIVE
Protein, ur: NEGATIVE mg/dL
SPECIFIC GRAVITY, URINE: 1.024 (ref 1.005–1.030)
pH: 6 (ref 5.0–8.0)

## 2017-07-07 LAB — BASIC METABOLIC PANEL
ANION GAP: 7 (ref 5–15)
BUN: 27 mg/dL — ABNORMAL HIGH (ref 6–20)
CALCIUM: 9.2 mg/dL (ref 8.9–10.3)
CO2: 29 mmol/L (ref 22–32)
Chloride: 104 mmol/L (ref 101–111)
Creatinine, Ser: 0.71 mg/dL (ref 0.44–1.00)
GLUCOSE: 93 mg/dL (ref 65–99)
POTASSIUM: 4.8 mmol/L (ref 3.5–5.1)
Sodium: 140 mmol/L (ref 135–145)

## 2017-07-07 LAB — CBC WITH DIFFERENTIAL/PLATELET
BASOS ABS: 0 10*3/uL (ref 0.0–0.1)
BASOS PCT: 0 %
Eosinophils Absolute: 0.1 10*3/uL (ref 0.0–0.7)
Eosinophils Relative: 2 %
HEMATOCRIT: 41.3 % (ref 36.0–46.0)
Hemoglobin: 13.5 g/dL (ref 12.0–15.0)
LYMPHS PCT: 20 %
Lymphs Abs: 1.3 10*3/uL (ref 0.7–4.0)
MCH: 30.8 pg (ref 26.0–34.0)
MCHC: 32.7 g/dL (ref 30.0–36.0)
MCV: 94.3 fL (ref 78.0–100.0)
Monocytes Absolute: 0.4 10*3/uL (ref 0.1–1.0)
Monocytes Relative: 7 %
NEUTROS ABS: 4.5 10*3/uL (ref 1.7–7.7)
NEUTROS PCT: 71 %
Platelets: 217 10*3/uL (ref 150–400)
RBC: 4.38 MIL/uL (ref 3.87–5.11)
RDW: 13.1 % (ref 11.5–15.5)
WBC: 6.4 10*3/uL (ref 4.0–10.5)

## 2017-07-07 LAB — SURGICAL PCR SCREEN
MRSA, PCR: NEGATIVE
Staphylococcus aureus: NEGATIVE

## 2017-07-07 LAB — APTT: APTT: 29 s (ref 24–36)

## 2017-07-07 LAB — PROTIME-INR
INR: 0.97
Prothrombin Time: 12.8 seconds (ref 11.4–15.2)

## 2017-07-08 LAB — ABO/RH: ABO/RH(D): B POS

## 2017-07-11 ENCOUNTER — Other Ambulatory Visit: Payer: Self-pay | Admitting: Orthopedic Surgery

## 2017-07-11 NOTE — Care Plan (Signed)
Spoke with patient prior to surgery. 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 She has toured and prefers [facility]. She would like for her husband to transport her. Her final equipment needs will be determined at the SNF    Please contact Shauna HughRenee Angiulli, [facility] [phone] with questions or if this plan should need to change   Thanks

## 2017-07-13 DIAGNOSIS — M1712 Unilateral primary osteoarthritis, left knee: Secondary | ICD-10-CM | POA: Diagnosis present

## 2017-07-13 NOTE — Addendum Note (Signed)
Addended by: Dannielle BurnPHILLIPS, Andreea Arca K on: 07/13/2017 01:16 PM   Modules accepted: Orders

## 2017-07-13 NOTE — H&P (Signed)
TOTAL KNEE ADMISSION H&P  Patient is being admitted for left total knee arthroplasty.  Subjective:  Chief Complaint:left knee pain.  HPI: Wendy Clark, 67 y.o. female, has a history of pain and functional disability in the left knee due to arthritis and has failed non-surgical conservative treatments for greater than 12 weeks to includeNSAID's and/or analgesics, corticosteriod injections and activity modification.  Onset of symptoms was gradual, starting 2 years ago with gradually worsening course since that time. The patient noted no past surgery on the left knee(s).  Patient currently rates pain in the left knee(s) at 10 out of 10 with activity. Patient has night pain, worsening of pain with activity and weight bearing, pain that interferes with activities of daily living, pain with passive range of motion and joint swelling.  Patient has evidence of joint space narrowing by imaging studies.  There is no active infection.  There are no active problems to display for this patient.  Past Medical History:  Diagnosis Date  . Allergic rhinitis   . Arthritis    oa  . Headache    occ stress HA   . Osteoporosis   . Tremors of nervous system   . Urinary urgency     Past Surgical History:  Procedure Laterality Date  . NO PAST SURGERIES      No current facility-administered medications for this encounter.    Current Outpatient Medications  Medication Sig Dispense Refill Last Dose  . aspirin EC 325 MG tablet Take 325 mg by mouth daily as needed (headaches).     . Calcium Carbonate-Vitamin D (CALCIUM-D PO) Take 1 tablet by mouth 2 (two) times daily.     . cholecalciferol (VITAMIN D) 1000 units tablet Take 1,000 Units by mouth 2 (two) times daily.     . diclofenac sodium (VOLTAREN) 1 % GEL Apply 1 application topically as needed (pain).     . fluconazole (DIFLUCAN) 150 MG tablet Take 150 mg by mouth every Thursday.     Marland Kitchen. FLUoxetine (PROZAC) 20 MG capsule Take 20 mg by mouth daily.   Taking   . loratadine (CLARITIN) 10 MG tablet Take 10 mg by mouth daily as needed for allergies.    Taking  . naproxen sodium (ALEVE) 220 MG tablet Take 220 mg by mouth 2 (two) times daily as needed (pain).     . Omega-3 Fatty Acids (FISH OIL) 1200 MG CAPS Take 1,200 mg by mouth every evening.    Taking  . vitamin B-12 (CYANOCOBALAMIN) 100 MCG tablet Take 100 mcg by mouth daily.   Taking   Allergies  Allergen Reactions  . Novocain [Procaine]     pt stated, "I get faint, pass out, light-headed"    Social History   Tobacco Use  . Smoking status: Former Smoker    Years: 15.00    Types: Cigarettes  . Smokeless tobacco: Never Used  Substance Use Topics  . Alcohol use: Yes    Comment: 2 glasses red wine 1-2 times a month socially     No family history on file.   Review of Systems  Constitutional: Negative.   HENT: Positive for sinus pain.   Eyes: Negative.        Poor vision  Respiratory: Negative.   Cardiovascular: Negative.   Gastrointestinal: Negative.   Genitourinary: Positive for frequency and urgency.  Musculoskeletal: Positive for joint pain and myalgias.  Skin: Positive for rash.  Neurological: Positive for tremors.  Endo/Heme/Allergies: Bruises/bleeds easily.  Psychiatric/Behavioral: Negative.  Objective:  Physical Exam  Constitutional: She is oriented to person, place, and time. She appears well-developed and well-nourished.  HENT:  Head: Normocephalic and atraumatic.  Eyes: Pupils are equal, round, and reactive to light.  Neck: Normal range of motion. Neck supple.  Cardiovascular: Intact distal pulses.  Respiratory: Effort normal.  Musculoskeletal: She exhibits tenderness.  the patient has good strength good range of motion in the right knee.  Patient's left knee has a range from 5 to 120.  No instability.  She does have a valgus deformity.  Tenderness over the lateral joint line.  Mild crepitance with range of motion.  No noticeable effusion.  No erythema or  warmth.  Neurological: She is alert and oriented to person, place, and time.  Skin: Skin is warm and dry.  Psychiatric: She has a normal mood and affect. Her behavior is normal. Judgment and thought content normal.    Vital signs in last 24 hours:    Labs:   Estimated body mass index is 28.7 kg/m as calculated from the following:   Height as of 07/07/17: 5\' 3"  (1.6 m).   Weight as of 07/07/17: 73.5 kg (162 lb).   Imaging Review Plain radiographs demonstrate a left knee with end-stage arthritis bone-on-bone of the lateral compartment with valgus deformity.   Preoperative templating of the joint replacement has been completed, documented, and submitted to the Operating Room personnel in order to optimize intra-operative equipment management.   Anticipated LOS equal to or greater than 2 midnights due to - Age 14 and older with one or more of the following:  - Obesity  - Expected need for hospital services (PT, OT, Nursing) required for safe  discharge         Assessment/Plan:  End stage arthritis, left knee   The patient history, physical examination, clinical judgment of the provider and imaging studies are consistent with end stage degenerative joint disease of the left knee(s) and total knee arthroplasty is deemed medically necessary. The treatment options including medical management, injection therapy arthroscopy and arthroplasty were discussed at length. The risks and benefits of total knee arthroplasty were presented and reviewed. The risks due to aseptic loosening, infection, stiffness, patella tracking problems, thromboembolic complications and other imponderables were discussed. The patient acknowledged the explanation, agreed to proceed with the plan and consent was signed. Patient is being admitted for inpatient treatment for surgery, pain control, PT, OT, prophylactic antibiotics, VTE prophylaxis, progressive ambulation and ADL's and discharge planning. The patient is  planning to be discharged home with home health services.

## 2017-07-14 ENCOUNTER — Telehealth (HOSPITAL_COMMUNITY): Payer: Self-pay | Admitting: *Deleted

## 2017-07-14 DIAGNOSIS — Z136 Encounter for screening for cardiovascular disorders: Secondary | ICD-10-CM | POA: Diagnosis not present

## 2017-07-14 DIAGNOSIS — Z01810 Encounter for preprocedural cardiovascular examination: Secondary | ICD-10-CM | POA: Diagnosis not present

## 2017-07-16 MED ORDER — TRANEXAMIC ACID 1000 MG/10ML IV SOLN
2000.0000 mg | INTRAVENOUS | Status: AC
  Filled 2017-07-16: qty 20

## 2017-07-16 MED ORDER — TRANEXAMIC ACID 1000 MG/10ML IV SOLN
1000.0000 mg | INTRAVENOUS | Status: AC
  Administered 2017-07-17: 1000 mg via INTRAVENOUS
  Filled 2017-07-16: qty 1100

## 2017-07-16 MED ORDER — BUPIVACAINE LIPOSOME 1.3 % IJ SUSP
20.0000 mL | INTRAMUSCULAR | Status: AC
  Filled 2017-07-16: qty 20

## 2017-07-17 ENCOUNTER — Telehealth (HOSPITAL_COMMUNITY): Payer: Self-pay | Admitting: *Deleted

## 2017-07-17 ENCOUNTER — Encounter (HOSPITAL_COMMUNITY): Payer: Self-pay

## 2017-07-17 ENCOUNTER — Inpatient Hospital Stay (HOSPITAL_COMMUNITY)
Admission: RE | Admit: 2017-07-17 | Discharge: 2017-07-19 | DRG: 470 | Disposition: A | Discharge status: Skilled Nursing Facility | Payer: Medicare Other | Source: Ambulatory Visit | Attending: Orthopedic Surgery | Admitting: Orthopedic Surgery

## 2017-07-17 ENCOUNTER — Inpatient Hospital Stay (HOSPITAL_COMMUNITY): Payer: Medicare Other | Admitting: Anesthesiology

## 2017-07-17 ENCOUNTER — Other Ambulatory Visit: Payer: Self-pay

## 2017-07-17 ENCOUNTER — Encounter (HOSPITAL_COMMUNITY)
Admission: RE | Disposition: A | Discharge status: Skilled Nursing Facility | Payer: Self-pay | Source: Ambulatory Visit | Attending: Orthopedic Surgery

## 2017-07-17 DIAGNOSIS — Z6828 Body mass index (BMI) 28.0-28.9, adult: Secondary | ICD-10-CM

## 2017-07-17 DIAGNOSIS — M1712 Unilateral primary osteoarthritis, left knee: Principal | ICD-10-CM | POA: Diagnosis present

## 2017-07-17 DIAGNOSIS — Z79899 Other long term (current) drug therapy: Secondary | ICD-10-CM

## 2017-07-17 DIAGNOSIS — Z7982 Long term (current) use of aspirin: Secondary | ICD-10-CM

## 2017-07-17 DIAGNOSIS — J309 Allergic rhinitis, unspecified: Secondary | ICD-10-CM | POA: Diagnosis present

## 2017-07-17 DIAGNOSIS — Z87891 Personal history of nicotine dependence: Secondary | ICD-10-CM

## 2017-07-17 DIAGNOSIS — E669 Obesity, unspecified: Secondary | ICD-10-CM | POA: Diagnosis present

## 2017-07-17 DIAGNOSIS — Z87820 Personal history of traumatic brain injury: Secondary | ICD-10-CM

## 2017-07-17 DIAGNOSIS — G8918 Other acute postprocedural pain: Secondary | ICD-10-CM | POA: Diagnosis not present

## 2017-07-17 DIAGNOSIS — Z888 Allergy status to other drugs, medicaments and biological substances status: Secondary | ICD-10-CM

## 2017-07-17 DIAGNOSIS — D62 Acute posthemorrhagic anemia: Secondary | ICD-10-CM | POA: Diagnosis not present

## 2017-07-17 HISTORY — PX: TOTAL KNEE ARTHROPLASTY: SHX125

## 2017-07-17 LAB — TYPE AND SCREEN
ABO/RH(D): B POS
Antibody Screen: NEGATIVE

## 2017-07-17 SURGERY — ARTHROPLASTY, KNEE, TOTAL
Anesthesia: Spinal | Site: Knee | Laterality: Left

## 2017-07-17 MED ORDER — ASPIRIN 81 MG PO CHEW
81.0000 mg | CHEWABLE_TABLET | Freq: Two times a day (BID) | ORAL | Status: DC
  Administered 2017-07-17 – 2017-07-19 (×4): 81 mg via ORAL
  Filled 2017-07-17 (×4): qty 1

## 2017-07-17 MED ORDER — PANTOPRAZOLE SODIUM 40 MG PO TBEC
40.0000 mg | DELAYED_RELEASE_TABLET | Freq: Every day | ORAL | Status: DC
  Administered 2017-07-17 – 2017-07-18 (×2): 40 mg via ORAL
  Filled 2017-07-17 (×2): qty 1

## 2017-07-17 MED ORDER — DEXAMETHASONE SODIUM PHOSPHATE 10 MG/ML IJ SOLN
10.0000 mg | Freq: Once | INTRAMUSCULAR | Status: AC
  Administered 2017-07-18: 10 mg via INTRAVENOUS
  Filled 2017-07-17: qty 1

## 2017-07-17 MED ORDER — ONDANSETRON HCL 4 MG/2ML IJ SOLN
INTRAMUSCULAR | Status: AC
  Filled 2017-07-17: qty 2

## 2017-07-17 MED ORDER — FENTANYL CITRATE (PF) 100 MCG/2ML IJ SOLN
50.0000 ug | INTRAMUSCULAR | Status: DC
  Administered 2017-07-17 (×2): 25 ug via INTRAVENOUS

## 2017-07-17 MED ORDER — BUPIVACAINE IN DEXTROSE 0.75-8.25 % IT SOLN
INTRATHECAL | Status: DC | PRN
  Administered 2017-07-17: 1.6 mL via INTRATHECAL

## 2017-07-17 MED ORDER — SODIUM CHLORIDE 0.9 % IV SOLN
1000.0000 mg | Freq: Once | INTRAVENOUS | Status: AC
  Administered 2017-07-17: 1000 mg via INTRAVENOUS
  Filled 2017-07-17: qty 10

## 2017-07-17 MED ORDER — DOCUSATE SODIUM 100 MG PO CAPS
100.0000 mg | ORAL_CAPSULE | Freq: Two times a day (BID) | ORAL | Status: DC
  Administered 2017-07-17 – 2017-07-19 (×4): 100 mg via ORAL
  Filled 2017-07-17 (×4): qty 1

## 2017-07-17 MED ORDER — OXYCODONE HCL 5 MG PO TABS
5.0000 mg | ORAL_TABLET | ORAL | Status: DC | PRN
  Administered 2017-07-17 – 2017-07-18 (×4): 10 mg via ORAL
  Administered 2017-07-18: 5 mg via ORAL
  Administered 2017-07-18: 10 mg via ORAL
  Administered 2017-07-18 (×2): 5 mg via ORAL
  Administered 2017-07-19: 10 mg via ORAL
  Filled 2017-07-17: qty 2
  Filled 2017-07-17: qty 1
  Filled 2017-07-17 (×3): qty 2
  Filled 2017-07-17 (×2): qty 1
  Filled 2017-07-17 (×2): qty 2

## 2017-07-17 MED ORDER — SODIUM CHLORIDE 0.9 % IR SOLN
Status: DC | PRN
  Administered 2017-07-17 (×2): 1000 mL

## 2017-07-17 MED ORDER — FLEET ENEMA 7-19 GM/118ML RE ENEM: 1.0000 | ENEMA | Freq: Once | RECTAL | Status: DC | PRN

## 2017-07-17 MED ORDER — BUPIVACAINE-EPINEPHRINE (PF) 0.5% -1:200000 IJ SOLN
INTRAMUSCULAR | Status: DC | PRN
  Administered 2017-07-17: 20 mL

## 2017-07-17 MED ORDER — ROPIVACAINE HCL 5 MG/ML IJ SOLN
INTRAMUSCULAR | Status: DC | PRN
  Administered 2017-07-17: 30 mL via PERINEURAL

## 2017-07-17 MED ORDER — PROPOFOL 10 MG/ML IV BOLUS
INTRAVENOUS | Status: AC
  Filled 2017-07-17: qty 20

## 2017-07-17 MED ORDER — FENTANYL CITRATE (PF) 100 MCG/2ML IJ SOLN
INTRAMUSCULAR | Status: AC
  Administered 2017-07-17: 25 ug via INTRAVENOUS
  Filled 2017-07-17: qty 2

## 2017-07-17 MED ORDER — EPHEDRINE 5 MG/ML INJ
INTRAVENOUS | Status: AC
  Filled 2017-07-17: qty 10

## 2017-07-17 MED ORDER — PHENYLEPHRINE 40 MCG/ML (10ML) SYRINGE FOR IV PUSH (FOR BLOOD PRESSURE SUPPORT)
PREFILLED_SYRINGE | INTRAVENOUS | Status: AC
  Filled 2017-07-17: qty 10

## 2017-07-17 MED ORDER — VITAMIN D3 25 MCG (1000 UNIT) PO TABS
1000.0000 [IU] | ORAL_TABLET | Freq: Two times a day (BID) | ORAL | Status: DC
  Administered 2017-07-17 – 2017-07-19 (×4): 1000 [IU] via ORAL
  Filled 2017-07-17 (×4): qty 1

## 2017-07-17 MED ORDER — DIPHENHYDRAMINE HCL 12.5 MG/5ML PO ELIX: 12.5000 mg | ORAL_SOLUTION | ORAL | Status: DC | PRN

## 2017-07-17 MED ORDER — STERILE WATER FOR IRRIGATION IR SOLN
Status: DC | PRN
  Administered 2017-07-17: 2000 mL

## 2017-07-17 MED ORDER — MIDAZOLAM HCL 2 MG/2ML IJ SOLN
INTRAMUSCULAR | Status: AC
  Filled 2017-07-17: qty 2

## 2017-07-17 MED ORDER — METHOCARBAMOL 500 MG PO TABS
500.0000 mg | ORAL_TABLET | Freq: Four times a day (QID) | ORAL | Status: DC | PRN
  Administered 2017-07-17 – 2017-07-19 (×4): 500 mg via ORAL
  Filled 2017-07-17 (×4): qty 1

## 2017-07-17 MED ORDER — METHOCARBAMOL 1000 MG/10ML IJ SOLN
500.0000 mg | Freq: Four times a day (QID) | INTRAVENOUS | Status: DC | PRN
  Administered 2017-07-17: 500 mg via INTRAVENOUS
  Filled 2017-07-17: qty 550

## 2017-07-17 MED ORDER — ASPIRIN EC 81 MG PO TBEC: 81.0000 mg | DELAYED_RELEASE_TABLET | Freq: Two times a day (BID) | ORAL | 0 refills | Status: DC

## 2017-07-17 MED ORDER — PROPOFOL 10 MG/ML IV BOLUS
INTRAVENOUS | Status: AC
  Filled 2017-07-17: qty 40

## 2017-07-17 MED ORDER — TIZANIDINE HCL 2 MG PO TABS: 2.0000 mg | ORAL_TABLET | Freq: Four times a day (QID) | ORAL | 0 refills | Status: DC | PRN

## 2017-07-17 MED ORDER — KCL IN DEXTROSE-NACL 20-5-0.45 MEQ/L-%-% IV SOLN
INTRAVENOUS | Status: DC
  Administered 2017-07-17: 1 mL via INTRAVENOUS
  Administered 2017-07-17 – 2017-07-18 (×2): via INTRAVENOUS
  Administered 2017-07-18: 1 mL via INTRAVENOUS
  Filled 2017-07-17 (×5): qty 1000

## 2017-07-17 MED ORDER — ACETAMINOPHEN 325 MG PO TABS
325.0000 mg | ORAL_TABLET | Freq: Four times a day (QID) | ORAL | Status: DC | PRN
  Administered 2017-07-18: 650 mg via ORAL
  Filled 2017-07-17: qty 2

## 2017-07-17 MED ORDER — HYDROMORPHONE HCL 1 MG/ML IJ SOLN: 0.5000 mg | INTRAMUSCULAR | Status: DC | PRN

## 2017-07-17 MED ORDER — CHLORHEXIDINE GLUCONATE 4 % EX LIQD: 60.0000 mL | Freq: Once | CUTANEOUS | Status: DC

## 2017-07-17 MED ORDER — OXYCODONE-ACETAMINOPHEN 5-325 MG PO TABS: 1.0000 | ORAL_TABLET | ORAL | 0 refills | Status: DC | PRN

## 2017-07-17 MED ORDER — ONDANSETRON HCL 4 MG/2ML IJ SOLN: 4.0000 mg | Freq: Once | INTRAMUSCULAR | Status: DC | PRN

## 2017-07-17 MED ORDER — PHENOL 1.4 % MT LIQD
1.0000 | OROMUCOSAL | Status: DC | PRN
  Filled 2017-07-17: qty 177

## 2017-07-17 MED ORDER — PROPOFOL 500 MG/50ML IV EMUL
INTRAVENOUS | Status: DC | PRN
  Administered 2017-07-17: 40 ug/kg/min via INTRAVENOUS

## 2017-07-17 MED ORDER — FLUCONAZOLE 150 MG PO TABS: 150.0000 mg | ORAL_TABLET | ORAL | Status: DC

## 2017-07-17 MED ORDER — DEXAMETHASONE SODIUM PHOSPHATE 10 MG/ML IJ SOLN
INTRAMUSCULAR | Status: DC | PRN
  Administered 2017-07-17: 10 mg via INTRAVENOUS

## 2017-07-17 MED ORDER — ONDANSETRON HCL 4 MG PO TABS: 4.0000 mg | ORAL_TABLET | Freq: Four times a day (QID) | ORAL | Status: DC | PRN

## 2017-07-17 MED ORDER — DICLOFENAC SODIUM 1 % TD GEL
1.0000 "application " | TRANSDERMAL | Status: DC | PRN
  Filled 2017-07-17: qty 100

## 2017-07-17 MED ORDER — VITAMIN B-12 100 MCG PO TABS
100.0000 ug | ORAL_TABLET | Freq: Every day | ORAL | Status: DC
  Administered 2017-07-17 – 2017-07-19 (×3): 100 ug via ORAL
  Filled 2017-07-17 (×3): qty 1

## 2017-07-17 MED ORDER — EPHEDRINE SULFATE-NACL 50-0.9 MG/10ML-% IV SOSY
PREFILLED_SYRINGE | INTRAVENOUS | Status: DC | PRN
  Administered 2017-07-17 (×2): 5 mg via INTRAVENOUS
  Administered 2017-07-17: 10 mg via INTRAVENOUS

## 2017-07-17 MED ORDER — ONDANSETRON HCL 4 MG/2ML IJ SOLN
INTRAMUSCULAR | Status: DC | PRN
  Administered 2017-07-17: 4 mg via INTRAVENOUS

## 2017-07-17 MED ORDER — CEFAZOLIN SODIUM-DEXTROSE 2-4 GM/100ML-% IV SOLN
2.0000 g | INTRAVENOUS | Status: AC
  Administered 2017-07-17: 2 g via INTRAVENOUS
  Filled 2017-07-17: qty 100

## 2017-07-17 MED ORDER — MIDAZOLAM HCL 2 MG/2ML IJ SOLN
1.0000 mg | INTRAMUSCULAR | Status: DC
  Administered 2017-07-17: 1 mg via INTRAVENOUS

## 2017-07-17 MED ORDER — DEXAMETHASONE SODIUM PHOSPHATE 10 MG/ML IJ SOLN
INTRAMUSCULAR | Status: AC
  Filled 2017-07-17: qty 1

## 2017-07-17 MED ORDER — FENTANYL CITRATE (PF) 100 MCG/2ML IJ SOLN
INTRAMUSCULAR | Status: AC
  Filled 2017-07-17: qty 2

## 2017-07-17 MED ORDER — CALCIUM CARBONATE-VITAMIN D 500-200 MG-UNIT PO TABS
ORAL_TABLET | Freq: Two times a day (BID) | ORAL | Status: DC
  Administered 2017-07-17 – 2017-07-19 (×4): 1 via ORAL
  Filled 2017-07-17 (×4): qty 1

## 2017-07-17 MED ORDER — SODIUM CHLORIDE 0.9 % IR SOLN
Status: DC | PRN
  Administered 2017-07-17: 1000 mL

## 2017-07-17 MED ORDER — SODIUM CHLORIDE 0.9 % IJ SOLN
INTRAMUSCULAR | Status: AC
  Filled 2017-07-17: qty 50

## 2017-07-17 MED ORDER — ALUM & MAG HYDROXIDE-SIMETH 200-200-20 MG/5ML PO SUSP: 30.0000 mL | ORAL | Status: DC | PRN

## 2017-07-17 MED ORDER — BISACODYL 5 MG PO TBEC: 5.0000 mg | DELAYED_RELEASE_TABLET | Freq: Every day | ORAL | Status: DC | PRN

## 2017-07-17 MED ORDER — BUPIVACAINE LIPOSOME 1.3 % IJ SUSP
INTRAMUSCULAR | Status: DC | PRN
  Administered 2017-07-17: 20 mL

## 2017-07-17 MED ORDER — TRANEXAMIC ACID 1000 MG/10ML IV SOLN
INTRAVENOUS | Status: DC | PRN
  Administered 2017-07-17: 2000 mg via TOPICAL

## 2017-07-17 MED ORDER — LORATADINE 10 MG PO TABS: 10.0000 mg | ORAL_TABLET | Freq: Every day | ORAL | Status: DC | PRN

## 2017-07-17 MED ORDER — LACTATED RINGERS IV SOLN
INTRAVENOUS | Status: DC
  Administered 2017-07-17 (×3): via INTRAVENOUS

## 2017-07-17 MED ORDER — SODIUM CHLORIDE 0.9 % IJ SOLN
INTRAMUSCULAR | Status: DC | PRN
  Administered 2017-07-17: 50 mL

## 2017-07-17 MED ORDER — METOCLOPRAMIDE HCL 5 MG/ML IJ SOLN
5.0000 mg | Freq: Three times a day (TID) | INTRAMUSCULAR | Status: DC | PRN
  Administered 2017-07-18: 5 mg via INTRAVENOUS
  Filled 2017-07-17: qty 2

## 2017-07-17 MED ORDER — PROPOFOL 10 MG/ML IV BOLUS
INTRAVENOUS | Status: DC | PRN
  Administered 2017-07-17: 30 mg via INTRAVENOUS
  Administered 2017-07-17 (×3): 20 mg via INTRAVENOUS
  Administered 2017-07-17: 10 mg via INTRAVENOUS
  Administered 2017-07-17: 20 mg via INTRAVENOUS

## 2017-07-17 MED ORDER — FENTANYL CITRATE (PF) 100 MCG/2ML IJ SOLN
25.0000 ug | INTRAMUSCULAR | Status: DC | PRN
  Administered 2017-07-17 (×2): 25 ug via INTRAVENOUS

## 2017-07-17 MED ORDER — POLYETHYLENE GLYCOL 3350 17 G PO PACK: 17.0000 g | PACK | Freq: Every day | ORAL | Status: DC | PRN

## 2017-07-17 MED ORDER — FLUOXETINE HCL 20 MG PO CAPS
20.0000 mg | ORAL_CAPSULE | Freq: Every day | ORAL | Status: DC
  Administered 2017-07-18 – 2017-07-19 (×2): 20 mg via ORAL
  Filled 2017-07-17 (×2): qty 1

## 2017-07-17 MED ORDER — GABAPENTIN 300 MG PO CAPS
300.0000 mg | ORAL_CAPSULE | Freq: Three times a day (TID) | ORAL | Status: DC
  Administered 2017-07-17 – 2017-07-19 (×6): 300 mg via ORAL
  Filled 2017-07-17 (×6): qty 1

## 2017-07-17 MED ORDER — ONDANSETRON HCL 4 MG/2ML IJ SOLN
4.0000 mg | Freq: Four times a day (QID) | INTRAMUSCULAR | Status: DC | PRN
  Administered 2017-07-18: 4 mg via INTRAVENOUS
  Filled 2017-07-17: qty 2

## 2017-07-17 MED ORDER — MENTHOL 3 MG MT LOZG: 1.0000 | LOZENGE | OROMUCOSAL | Status: DC | PRN

## 2017-07-17 MED ORDER — BUPIVACAINE-EPINEPHRINE (PF) 0.5% -1:200000 IJ SOLN
INTRAMUSCULAR | Status: AC
  Filled 2017-07-17: qty 30

## 2017-07-17 MED ORDER — METOCLOPRAMIDE HCL 5 MG PO TABS: 5.0000 mg | ORAL_TABLET | Freq: Three times a day (TID) | ORAL | Status: DC | PRN

## 2017-07-17 MED ORDER — PHENYLEPHRINE 40 MCG/ML (10ML) SYRINGE FOR IV PUSH (FOR BLOOD PRESSURE SUPPORT)
PREFILLED_SYRINGE | INTRAVENOUS | Status: DC | PRN
  Administered 2017-07-17: 40 ug via INTRAVENOUS
  Administered 2017-07-17 (×7): 80 ug via INTRAVENOUS

## 2017-07-17 SURGICAL SUPPLY — 51 items
BAG DECANTER FOR FLEXI CONT (MISCELLANEOUS) ×2 IMPLANT
BAG SPEC THK2 15X12 ZIP CLS (MISCELLANEOUS) ×1
BAG ZIPLOCK 12X15 (MISCELLANEOUS) ×2 IMPLANT
BANDAGE ELASTIC 6 VELCRO ST LF (GAUZE/BANDAGES/DRESSINGS) ×1 IMPLANT
BLADE SAG 18X100X1.27 (BLADE) ×2 IMPLANT
BLADE SAW SGTL 13.0X1.19X90.0M (BLADE) ×2 IMPLANT
BNDG CMPR MED 10X6 ELC LF (GAUZE/BANDAGES/DRESSINGS) ×1
BNDG ELASTIC 6X10 VLCR STRL LF (GAUZE/BANDAGES/DRESSINGS) ×2 IMPLANT
BOWL SMART MIX CTS (DISPOSABLE) ×2 IMPLANT
CAPT KNEE TOTAL 3 ATTUNE ×1 IMPLANT
CEMENT HV SMART SET (Cement) ×4 IMPLANT
COVER SURGICAL LIGHT HANDLE (MISCELLANEOUS) ×2 IMPLANT
CUFF TOURN SGL QUICK 34 (TOURNIQUET CUFF) ×2
CUFF TRNQT CYL 34X4X40X1 (TOURNIQUET CUFF) ×1 IMPLANT
DECANTER SPIKE VIAL GLASS SM (MISCELLANEOUS) ×6 IMPLANT
DRAPE ORTHO SPLIT 77X108 STRL (DRAPES) ×2
DRAPE SURG ORHT 6 SPLT 77X108 (DRAPES) ×1 IMPLANT
DRAPE U-SHAPE 47X51 STRL (DRAPES) ×2 IMPLANT
DRESSING AQUACEL AG SP 3.5X10 (GAUZE/BANDAGES/DRESSINGS) IMPLANT
DRSG AQUACEL AG ADV 3.5X10 (GAUZE/BANDAGES/DRESSINGS) ×2 IMPLANT
DRSG AQUACEL AG SP 3.5X10 (GAUZE/BANDAGES/DRESSINGS) ×2
DURAPREP 26ML APPLICATOR (WOUND CARE) ×2 IMPLANT
ELECT REM PT RETURN 15FT ADLT (MISCELLANEOUS) ×2 IMPLANT
GLOVE BIO SURGEON STRL SZ7.5 (GLOVE) ×2 IMPLANT
GLOVE BIO SURGEON STRL SZ8.5 (GLOVE) ×2 IMPLANT
GLOVE BIOGEL PI IND STRL 7.0 (GLOVE) IMPLANT
GLOVE BIOGEL PI IND STRL 8 (GLOVE) ×1 IMPLANT
GLOVE BIOGEL PI IND STRL 9 (GLOVE) ×1 IMPLANT
GLOVE BIOGEL PI INDICATOR 7.0 (GLOVE) ×2
GLOVE BIOGEL PI INDICATOR 8 (GLOVE) ×2
GLOVE BIOGEL PI INDICATOR 9 (GLOVE) ×1
GOWN SPEC L3 XXLG W/TWL (GOWN DISPOSABLE) ×1 IMPLANT
GOWN STRL REUS W/TWL XL LVL3 (GOWN DISPOSABLE) ×5 IMPLANT
HANDPIECE INTERPULSE COAX TIP (DISPOSABLE) ×2
HOLDER FOLEY CATH W/STRAP (MISCELLANEOUS) IMPLANT
HOOD PEEL AWAY FLYTE STAYCOOL (MISCELLANEOUS) ×6 IMPLANT
NEEDLE HYPO 22GX1.5 SAFETY (NEEDLE) ×2 IMPLANT
PACK ICE MAXI GEL EZY WRAP (MISCELLANEOUS) ×2 IMPLANT
PACK TOTAL KNEE CUSTOM (KITS) ×2 IMPLANT
POSITIONER SURGICAL ARM (MISCELLANEOUS) ×2 IMPLANT
SET HNDPC FAN SPRY TIP SCT (DISPOSABLE) ×1 IMPLANT
STAPLER VISISTAT 35W (STAPLE) IMPLANT
SUT VIC AB 1 CTX 36 (SUTURE) ×2
SUT VIC AB 1 CTX36XBRD ANBCTR (SUTURE) ×1 IMPLANT
SUT VIC AB 2-0 CT1 27 (SUTURE) ×2
SUT VIC AB 2-0 CT1 TAPERPNT 27 (SUTURE) ×1 IMPLANT
SUT VIC AB 3-0 CT1 27 (SUTURE) ×2
SUT VIC AB 3-0 CT1 TAPERPNT 27 (SUTURE) ×1 IMPLANT
SYR CONTROL 10ML LL (SYRINGE) ×4 IMPLANT
TRAY FOLEY CATH 16FRSI W/METER (SET/KITS/TRAYS/PACK) ×2 IMPLANT
WRAP KNEE MAXI GEL POST OP (GAUZE/BANDAGES/DRESSINGS) ×1 IMPLANT

## 2017-07-17 NOTE — Progress Notes (Signed)
Care Plan Notes 04/18/2017 to 07/17/2017       Care Plan by Shauna HughAngiulli, Renee at 07/11/2017  3:25 PM    Date of Service   Author Author Type Status Note Type File Time  07/11/2017  Shauna Hughngiulli, Renee  Signed Care Plan 07/11/2017             Spoke with patient prior to surgery. 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 She has toured and prefers [facility]. She would like for her husband to transport her. Her final equipment needs will be determined at the SNF    Please contact Shauna HughRenee Angiulli, [facility] [phone] with questions or if this plan should need to change   Thanks

## 2017-07-17 NOTE — Transfer of Care (Signed)
Immediate Anesthesia Transfer of Care Note  Patient: Wendy HammingSusan T Clark  Procedure(s) Performed: LEFT TOTAL KNEE ARTHROPLASTY (Left Knee)  Patient Location: PACU  Anesthesia Type:Spinal  Level of Consciousness: drowsy and patient cooperative  Airway & Oxygen Therapy: Patient Spontanous Breathing and Patient connected to nasal cannula oxygen  Post-op Assessment: Report given to RN and Post -op Vital signs reviewed and stable  Post vital signs: Reviewed and stable  Last Vitals:  Vitals Value Taken Time  BP 91/64 07/17/2017 12:25 PM  Temp    Pulse 75 07/17/2017 12:25 PM  Resp 15 07/17/2017 12:27 PM  SpO2 98 % 07/17/2017 12:25 PM  Vitals shown include unvalidated device data.  Last Pain:  Vitals:   07/17/17 1013  TempSrc:   PainSc: 0-No pain      Patients Stated Pain Goal: 0 (07/17/17 0955)  Complications: No apparent anesthesia complications

## 2017-07-17 NOTE — Anesthesia Procedure Notes (Signed)
Anesthesia Regional Block: Adductor canal block   Pre-Anesthetic Checklist: ,, timeout performed, Correct Patient, Correct Site, Correct Laterality, Correct Procedure,, site marked, risks and benefits discussed, Surgical consent,  Pre-op evaluation,  At surgeon's request and post-op pain management  Laterality: Left  Prep: chloraprep       Needles:  Injection technique: Single-shot  Needle Type: Echogenic Stimulator Needle     Needle Length: 9cm  Needle Gauge: 21     Additional Needles:   Procedures:,,,, ultrasound used (permanent image in chart),,,,  Narrative:  Start time: 07/17/2017 9:55 AM End time: 07/17/2017 10:05 AM Injection made incrementally with aspirations every 5 mL.  Performed by: Personally  Anesthesiologist: Leonides GrillsEllender, Ryan P, MD  Additional Notes: Functioning IV was confirmed and monitors were applied.  A 90mm 21ga Arrow echogenic stimulator needle was used. Sterile prep, hand hygiene and sterile gloves were used.  Negative aspiration and negative test dose prior to incremental administration of local anesthetic. The patient tolerated the procedure well.

## 2017-07-17 NOTE — Op Note (Signed)
PATIENT ID:      Wendy Clark  MRN:     [id number] DOB/AGE:    December 12, 1950 / 67 y.o.       OPERATIVE REPORT    DATE OF PROCEDURE:  07/17/2017       PREOPERATIVE DIAGNOSIS:   LEFT KNEE OSTEOARTHRITIS      Estimated body mass index is 28.7 kg/m as calculated from the following:   Height as of this encounter:  (1.6 m).   Weight as of this encounter: 162 lb (73.5 kg).                                                        POSTOPERATIVE DIAGNOSIS:   LEFT KNEE OSTEOARTHRITIS                                                                      PROCEDURE:  Procedure(s): LEFT TOTAL KNEE ARTHROPLASTY Using DepuyAttune RP implants #6L Femur, #6Tibia, 5 mm Attune RP bearing, 38 Patella     SURGEON: Nestor Lewandowsky    ASSISTANT:   Tomi Likens. [employer]   (Present and scrubbed throughout the case, critical for assistance with exposure, retraction, instrumentation, and closure.)         ANESTHESIA: Spinal, 20cc Exparel, 50cc 0.25% Marcaine  EBL: 300cc  FLUID REPLACEMENT: 1500 crystalloid  TOURNIQUET TIME:  Drains: None  Tranexamic Acid: 1gm IV, 2gm topical  COMPLICATIONS:  None         INDICATIONS FOR PROCEDURE: The patient has  LEFT KNEE OSTEOARTHRITIS, Val deformities, XR shows bone on bone arthritis, lateral subluxation of tibia. Patient has failed all conservative measures including anti-inflammatory medicines, narcotics, attempts at  exercise and weight loss, cortisone injections and viscosupplementation.  Risks and benefits of surgery have been discussed, questions answered.   DESCRIPTION OF PROCEDURE: The patient identified by armband, received  IV antibiotics, in the holding area at [facility]. Patient taken to the operating room, appropriate anesthetic  monitors were attached, and Spinal anesthesia was  induced. Tourniquet  applied high to the operative thigh. Lateral post and foot positioner  applied to the table, the lower extremity was then prepped and draped  in  usual sterile fashion from the toes to the tourniquet. Time-out procedure was performed. We began the operation, with the knee flexed 120 degrees, by making the anterior midline incision starting at handbreadth above the patella going over the patella 1 cm medial to and 4 cm distal to the tibial tubercle. Small bleeders in the skin and the  subcutaneous tissue identified and cauterized. Transverse retinaculum was incised and reflected medially and a medial parapatellar arthrotomy was accomplished. the patella was everted and theprepatellar fat pad resected. The superficial medial collateral  ligament was then elevated from anterior to posterior along the proximal  flare of the tibia and anterior half of the menisci resected. The knee was hyperflexed exposing bone on bone arthritis. Peripheral and notch osteophytes as well as the cruciate ligaments were then resected. We continued to  work our way around posteriorly along the  proximal tibia, and externally  rotated the tibia subluxing it out from underneath the femur. A McHale  retractor was placed through the notch and a lateral Hohmann retractor  placed, and we then drilled through the proximal tibia in line with the  axis of the tibia followed by an intramedullary guide rod and 2-degree  posterior slope cutting guide. The tibial cutting guide, 3 degree posterior sloped, was pinned into place allowing resection of 7 mm of bone medially and 0 mm of bone laterally. Satisfied with the tibial resection, we then  entered the distal femur 2 mm anterior to the PCL origin with the  intramedullary guide rod and applied the distal femoral cutting guide  set at 9 mm, with 7 degrees of valgus. This was pinned along the  epicondylar axis. At this point, the distal femoral cut was accomplished without difficulty. We then sized for a #6L femoral component and pinned the guide in 0 degrees of external rotation. The chamfer cutting guide was pinned into place. The  anterior, posterior, and chamfer cuts were accomplished without difficulty followed by  the Attune RP box cutting guide and the box cut. We also removed posterior osteophytes from the posterior femoral condyles. At this  time, the knee was brought into full extension. We checked our  extension and flexion gaps and found them symmetric for a 5 mm bearing. Distracting in extension with a lamina spreader, the posterior horns of the menisci were removed, and Exparel, diluted to 60 cc, with 20cc NS, and 20cc 0.5% Marcaine,was injected into the capsule and synovium of the knee. The posterior patella cut was accomplished with the 9.5 mm Attune cutting guide, sized for a 38mm dome, and the fixation pegs drilled.The knee  was then once again hyperflexed exposing the proximal tibia. We sized for a # 6 tibial base plate, applied the smokestack and the conical reamer followed by the the Delta fin keel punch. We then hammered into place the Attune RP trial femoral component, drilled the lugs, inserted a  5 mm trial bearing, trial patellar button, and took the knee through range of motion from 0-130 degrees. No thumb pressure was required for patellar Tracking. At this point, the limb was wrapped with an Esmarch bandage and the tourniquet inflated to 350 mmHg. All trial components were removed, mating surfaces irrigated with pulse lavage, and dried with suction and sponges. 10 cc of the Exparel solution was applied to the cancellus bone of the patella distal femur and proximal tibia.  After waiting 1 minute, the bony surfaces were again, dried with sponges. A double batch of DePuy HV cement with 1500 mg of Zinacef was mixed and applied to all bony metallic mating surfaces except for the posterior condyles of the femur itself. In order, we hammered into place the tibial tray and removed excess cement, the femoral component and removed excess cement. The final Attune RP bearing  was inserted, and the knee brought to full  extension with compression.  The patellar button was clamped into place, and excess cement  removed. While the cement cured the wound was irrigated out with normal saline solution pulse lavage. Ligament stability and patellar tracking were checked and found to be excellent. The parapatellar arthrotomy was closed with  running #1 Vicryl suture. The subcutaneous tissue with 0 and 2-0 undyed  Vicryl suture, and the skin with running 3-0 SQ vicryl. A dressing of Xeroform,  4 x 4, dressing sponges, Webril, and Ace wrap applied. The patient  awakened,  and taken to recovery room without difficulty.   Nestor Lewandowsky 07/17/2017, 12:02 PM

## 2017-07-17 NOTE — Evaluation (Addendum)
Physical Therapy Evaluation Patient Details Name: Wendy Clark MRN: [id number] DOB: 02-25-1950 Today's Date: 07/17/2017   History of Present Illness  LTKA  Clinical Impression  The patient reports minimal  Pain. Plans SNF for rehab. Pt admitted with above diagnosis. Pt currently with functional limitations due to the deficits listed below (see PT Problem List). Pt will benefit from skilled PT to increase their independence and safety with mobility to allow discharge to the venue listed below.    The patient appeared to lack full active dorsiflexion during gait. Had somewhat of a steppage on the rRt > lt. Patient reports  Full sensation in the legs.   Follow Up Recommendations SNF    Equipment Recommendations  None recommended by PT    Recommendations for Other Services       Precautions / Restrictions Precautions Precautions: Fall;Knee Restrictions Weight Bearing Restrictions: No      Mobility  Bed Mobility Overal bed mobility: Needs Assistance Bed Mobility: Supine to Sit     Supine to sit: Supervision     General bed mobility comments: manages left leg  Transfers Overall transfer level: Needs assistance Equipment used: Rolling walker (2 wheeled) Transfers: Sit to/from Stand Sit to Stand: Min assist         General transfer comment: cues for hand and left leg.  Ambulation/Gait Ambulation/Gait assistance: Min assist Gait Distance (Feet): 80 Feet Assistive device: Rolling walker (2 wheeled) Gait Pattern/deviations: Step-to pattern;Step-through pattern     General Gait Details: cues for sequence  Stairs            Wheelchair Mobility    Modified Rankin (Stroke Patients Only)       Balance                                             Pertinent Vitals/Pain Pain Assessment: 0-10 Pain Score: 4  Pain Location: LEFT  KNEE Pain Descriptors / Indicators: Discomfort Pain Intervention(s): Monitored during session;Premedicated  before session;Ice applied    Home Living Family/patient expects to be discharged to:: Skilled nursing facility Living Arrangements: Spouse/significant other                    Prior Function Level of Independence: Independent               Hand Dominance        Extremity/Trunk Assessment   Upper Extremity Assessment Upper Extremity Assessment: Overall WFL for tasks assessed    Lower Extremity Assessment Lower Extremity Assessment: LLE deficits/detail LLE Deficits / Details: + + SLR, KNEE FLEXION 0-60        Communication   Communication: No difficulties  Cognition Arousal/Alertness: Awake/alert Behavior During Therapy: WFL for tasks assessed/performed Overall Cognitive Status: Within Functional Limits for tasks assessed                                        General Comments      Exercises     Assessment/Plan    PT Assessment Patient needs continued PT services  PT Problem List Decreased strength;Decreased range of motion;Decreased activity tolerance;Decreased mobility;Decreased knowledge of precautions;Decreased safety awareness;Decreased knowledge of use of DME;Pain       PT Treatment Interventions DME instruction    PT Goals (Current goals can  be found in the Care Plan section)  Acute Rehab PT Goals Patient Stated Goal: walk with out a limp PT Goal Formulation: With patient Time For Goal Achievement: 07/21/17 Potential to Achieve Goals: Good    Frequency 7X/week   Barriers to discharge Decreased caregiver support spouse unable to assist    Co-evaluation               AM-PAC PT "6 Clicks" Daily Activity  Outcome Measure Difficulty turning over in bed (including adjusting bedclothes, sheets and blankets)?: A Little Difficulty moving from lying on back to sitting on the side of the bed? : A Little Difficulty sitting down on and standing up from a chair with arms (e.g., wheelchair, bedside commode, etc,.)?: A  Little Help needed moving to and from a bed to chair (including a wheelchair)?: A Little Help needed walking in hospital room?: A Little Help needed climbing 3-5 steps with a railing? : A Lot 6 Click Score: 17    End of Session Equipment Utilized During Treatment: Gait belt Activity Tolerance: Patient tolerated treatment well Patient left: in chair;with call bell/phone within reach Nurse Communication: Mobility status PT Visit Diagnosis: Unsteadiness on feet (R26.81);Pain Pain - Right/Left: Left Pain - part of body: Knee    Time: 1610-1630 PT Time Calculation (min) (ACUTE ONLY): 20 min   Charges:   PT Evaluation $PT Eval Low Complexity: 1 Low     PT G CodesBlanchard Kelch:        Tailyn Hantz PT [phone]   Rada HayHill, Demian Maisel Elizabeth 07/17/2017, 5:27 PM

## 2017-07-17 NOTE — Anesthesia Procedure Notes (Signed)
Procedure Name: MAC Date/Time: 07/17/2017 10:17 AM Performed by: Deliah Boston, CRNA Pre-anesthesia Checklist: Patient identified, Emergency Drugs available, Suction available, Patient being monitored and Timeout performed Patient Re-evaluated:Patient Re-evaluated prior to induction Oxygen Delivery Method: Nasal cannula Preoxygenation: Pre-oxygenation with 100% oxygen Placement Confirmation: positive ETCO2,  CO2 detector and breath sounds checked- equal and bilateral

## 2017-07-17 NOTE — Anesthesia Preprocedure Evaluation (Addendum)
Anesthesia Evaluation  Patient identified by MRN, date of birth, ID band Patient awake    Reviewed: Allergy & Precautions, NPO status , Patient's Chart, lab work & pertinent test results  Airway Mallampati: II  TM Distance: >3 FB Neck ROM: Full    Dental no notable dental hx.    Pulmonary neg pulmonary ROS, former smoker,    Pulmonary exam normal breath sounds clear to auscultation       Cardiovascular negative cardio ROS Normal cardiovascular exam Rhythm:Regular Rate:Normal  ECG: NSR, rate 68   Neuro/Psych  Headaches, negative psych ROS   GI/Hepatic negative GI ROS, Neg liver ROS,   Endo/Other  negative endocrine ROS  Renal/GU negative Renal ROS     Musculoskeletal  (+) Arthritis ,   Abdominal   Peds  Hematology negative hematology ROS (+)   Anesthesia Other Findings   Reproductive/Obstetrics                            Anesthesia Physical Anesthesia Plan  ASA: II  Anesthesia Plan: Spinal and Regional   Post-op Pain Management:  Regional for Post-op pain   Induction: Intravenous  PONV Risk Score and Plan: 2 and Propofol infusion, Treatment may vary due to age or medical condition and Ondansetron  Airway Management Planned: Natural Airway  Additional Equipment:   Intra-op Plan:   Post-operative Plan:   Informed Consent: I have reviewed the patients History and Physical, chart, labs and discussed the procedure including the risks, benefits and alternatives for the proposed anesthesia with the patient or authorized representative who has indicated his/her understanding and acceptance.   Dental advisory given  Plan Discussed with: CRNA  Anesthesia Plan Comments:         Anesthesia Quick Evaluation

## 2017-07-17 NOTE — Interval H&P Note (Signed)
History and Physical Interval Note:  07/17/2017 10:03 AM  Wendy Clark  has presented today for surgery, with the diagnosis of LEFT KNEE OSTEOARTHRITIS  The various methods of treatment have been discussed with the patient and family. After consideration of risks, benefits and other options for treatment, the patient has consented to  Procedure(s): LEFT TOTAL KNEE ARTHROPLASTY (Left) as a surgical intervention .  The patient's history has been reviewed, patient examined, no change in status, stable for surgery.  I have reviewed the patient's chart and labs.  Questions were answered to the patient's satisfaction.     Nestor LewandowskyFrank J Jayse Hodkinson

## 2017-07-17 NOTE — Anesthesia Procedure Notes (Signed)
Spinal  Patient location during procedure: OR Start time: 07/17/2017 10:30 AM End time: 07/17/2017 10:35 AM Staffing Anesthesiologist: Leonides GrillsEllender, Josiyah Tozzi P, MD Performed: anesthesiologist  Preanesthetic Checklist Completed: patient identified, surgical consent, pre-op evaluation, timeout performed, IV checked, risks and benefits discussed and monitors and equipment checked Spinal Block Patient position: sitting Prep: DuraPrep Patient monitoring: cardiac monitor, continuous pulse ox and blood pressure Approach: midline Location: L4-5 Injection technique: single-shot Needle Needle type: Pencan  Needle gauge: 24 G Needle length: 9 cm Assessment Sensory level: T10 Additional Notes Functioning IV was confirmed and monitors were applied. Sterile prep and drape, including hand hygiene and sterile gloves were used. The patient was positioned and the spine was prepped. The skin was anesthetized with lidocaine.  Free flow of clear CSF was obtained prior to injecting local anesthetic into the CSF.  The spinal needle aspirated freely following injection.  The needle was carefully withdrawn.  The patient tolerated the procedure well.

## 2017-07-17 NOTE — Progress Notes (Signed)
Assisted Dr. Ellender with left, ultrasound guided, adductor canal block. Side rails up, monitors on throughout procedure. See vital signs in flow sheet. Tolerated Procedure well.  

## 2017-07-17 NOTE — Discharge Instructions (Signed)

## 2017-07-18 ENCOUNTER — Encounter (HOSPITAL_COMMUNITY): Payer: Self-pay | Admitting: Orthopedic Surgery

## 2017-07-18 DIAGNOSIS — Z471 Aftercare following joint replacement surgery: Secondary | ICD-10-CM | POA: Diagnosis not present

## 2017-07-18 DIAGNOSIS — Z96652 Presence of left artificial knee joint: Secondary | ICD-10-CM | POA: Diagnosis not present

## 2017-07-18 DIAGNOSIS — M810 Age-related osteoporosis without current pathological fracture: Secondary | ICD-10-CM | POA: Diagnosis not present

## 2017-07-18 DIAGNOSIS — Z87891 Personal history of nicotine dependence: Secondary | ICD-10-CM | POA: Diagnosis not present

## 2017-07-18 DIAGNOSIS — Z79899 Other long term (current) drug therapy: Secondary | ICD-10-CM | POA: Diagnosis not present

## 2017-07-18 DIAGNOSIS — M1990 Unspecified osteoarthritis, unspecified site: Secondary | ICD-10-CM | POA: Diagnosis not present

## 2017-07-18 DIAGNOSIS — Z6828 Body mass index (BMI) 28.0-28.9, adult: Secondary | ICD-10-CM | POA: Diagnosis not present

## 2017-07-18 DIAGNOSIS — F329 Major depressive disorder, single episode, unspecified: Secondary | ICD-10-CM | POA: Diagnosis not present

## 2017-07-18 DIAGNOSIS — Z888 Allergy status to other drugs, medicaments and biological substances status: Secondary | ICD-10-CM | POA: Diagnosis not present

## 2017-07-18 DIAGNOSIS — R51 Headache: Secondary | ICD-10-CM | POA: Diagnosis not present

## 2017-07-18 DIAGNOSIS — R3915 Urgency of urination: Secondary | ICD-10-CM | POA: Diagnosis not present

## 2017-07-18 DIAGNOSIS — M1712 Unilateral primary osteoarthritis, left knee: Secondary | ICD-10-CM | POA: Diagnosis present

## 2017-07-18 DIAGNOSIS — Z87820 Personal history of traumatic brain injury: Secondary | ICD-10-CM | POA: Diagnosis not present

## 2017-07-18 DIAGNOSIS — E669 Obesity, unspecified: Secondary | ICD-10-CM | POA: Diagnosis present

## 2017-07-18 DIAGNOSIS — D62 Acute posthemorrhagic anemia: Secondary | ICD-10-CM | POA: Diagnosis not present

## 2017-07-18 DIAGNOSIS — Z7982 Long term (current) use of aspirin: Secondary | ICD-10-CM | POA: Diagnosis not present

## 2017-07-18 DIAGNOSIS — J309 Allergic rhinitis, unspecified: Secondary | ICD-10-CM | POA: Diagnosis present

## 2017-07-18 DIAGNOSIS — R251 Tremor, unspecified: Secondary | ICD-10-CM | POA: Diagnosis not present

## 2017-07-18 LAB — BASIC METABOLIC PANEL
Anion gap: 6 (ref 5–15)
BUN: 14 mg/dL (ref 8–23)
CALCIUM: 8.7 mg/dL — AB (ref 8.9–10.3)
CHLORIDE: 105 mmol/L (ref 98–111)
CO2: 29 mmol/L (ref 22–32)
CREATININE: 0.59 mg/dL (ref 0.44–1.00)
GFR calc non Af Amer: >60 mL/min (ref >60)
Glucose, Bld: 134 mg/dL — ABNORMAL HIGH (ref 70–99)
Potassium: 4.3 mmol/L (ref 3.5–5.1)
SODIUM: 140 mmol/L (ref 135–145)

## 2017-07-18 LAB — CBC
HEMATOCRIT: 29.7 % — AB (ref 36.0–46.0)
HEMOGLOBIN: 9.5 g/dL — AB (ref 12.0–15.0)
MCH: 30.3 pg (ref 26.0–34.0)
MCHC: 32 g/dL (ref 30.0–36.0)
MCV: 94.6 fL (ref 78.0–100.0)
Platelets: 186 10*3/uL (ref 150–400)
RBC: 3.14 MIL/uL — ABNORMAL LOW (ref 3.87–5.11)
RDW: 13.3 % (ref 11.5–15.5)
WBC: 10.1 10*3/uL (ref 4.0–10.5)

## 2017-07-18 NOTE — Progress Notes (Signed)
[facility] prearranged prior to surgery.  FL2 complete and sent.  PASRR complete. Patient spouse to transport patient to SNF.   Vivi BarrackNicole Jerusalem Brownstein, Alexander MtLCSW, MSW Clinical Social Worker  [phone] 07/18/2017  12:16 PM

## 2017-07-18 NOTE — Progress Notes (Addendum)
Physical Therapy Treatment Patient Details Name: Wendy HammingSusan T Kawabata MRN: [id number] DOB: 05/30/1950 Today's Date: 07/18/2017    History of Present Illness LTKA    PT Comments    POD #1 assisted patient with supine to sit, sit to stand, and ambulation with RW. Patient supervisory with bed mobility and transfers; able to self manage movement of LE's. Patient ambulated 130 ft with RW with LOB while turning due to improper sequencing. Completed exercise not done in morning session and verbally explained seated exercises. Patient tolerated treatment well and reported no increase in pain after PT.   Follow Up Recommendations  SNF     Equipment Recommendations  None recommended by PT    Recommendations for Other Services       Precautions / Restrictions Precautions Precautions: Fall;Knee Restrictions Weight Bearing Restrictions: No    Mobility  Bed Mobility Overal bed mobility: Needs Assistance Bed Mobility: Supine to Sit     Supine to sit: Supervision     General bed mobility comments: self managed movement of LE's  Transfers Overall transfer level: Needs assistance Equipment used: Rolling walker (2 wheeled) Transfers: Sit to/from Stand Sit to Stand: Min guard            Ambulation/Gait Ambulation/Gait assistance: Min Chemical engineerassist Gait Distance (Feet): 130 Feet Assistive device: Rolling walker (2 wheeled) Gait Pattern/deviations: Step-to pattern;Step-through pattern     General Gait Details: LOB while turning; physcal assist needed to regain balance   Stairs             Wheelchair Mobility    Modified Rankin (Stroke Patients Only)       Balance                                            Cognition Arousal/Alertness: Awake/alert Behavior During Therapy: WFL for tasks assessed/performed Overall Cognitive Status: Within Functional Limits for tasks assessed                                        Exercises Total Joint  Exercises Hip ABduction/ADduction: AROM;Left;10 reps;Supine Straight Leg Raises: AROM;Left;10 reps;Supine    General Comments        Pertinent Vitals/Pain Pain Assessment: 0-10 Pain Score: 3  Pain Location: LEFT  KNEE Pain Descriptors / Indicators: Aching;Sore Pain Intervention(s): Limited activity within patient's tolerance;Repositioned;Ice applied;Monitored during session    Home Living                      Prior Function            PT Goals (current goals can now be found in the care plan section) Progress towards PT goals: Progressing toward goals    Frequency    7X/week      PT Plan Current plan remains appropriate    Co-evaluation              AM-PAC PT "6 Clicks" Daily Activity  Outcome Measure  Difficulty turning over in bed (including adjusting bedclothes, sheets and blankets)?: A Little Difficulty moving from lying on back to sitting on the side of the bed? : A Little Difficulty sitting down on and standing up from a chair with arms (e.g., wheelchair, bedside commode, etc,.)?: A Little Help needed moving to and from a bed to  chair (including a wheelchair)?: A Little Help needed walking in hospital room?: A Little Help needed climbing 3-5 steps with a railing? : A Lot 6 Click Score: 17    End of Session Equipment Utilized During Treatment: Gait belt Activity Tolerance: Patient tolerated treatment well Patient left: in bed;with call bell/phone within reach   PT Visit Diagnosis: Unsteadiness on feet (R26.81);Pain Pain - Right/Left: Left Pain - part of body: Knee     Time: 1610-9604 PT Time Calculation (min) (ACUTE ONLY): 25 min  Charges:  $Gait Training: 8-22 mins $Therapeutic Exercise: 8-22 mins                    G Codes:       Randa Lynn. SPTA 07/18/2017, 3:33 PM  Direct Supervision given throughout session and agree with above  Felecia Shelling  PTA [facility]  Acute  Rehab Pager      [phone]

## 2017-07-18 NOTE — Progress Notes (Signed)
PATIENT ID: Wendy Clark  MRN: [id number]  DOB/AGE:  66/12/1950 / 67 y.o.  1 Day Post-Op Procedure(s) (LRB): LEFT TOTAL KNEE ARTHROPLASTY (Left)    PROGRESS NOTE Subjective: Patient is alert, oriented, no Nausea, no Vomiting, yes passing gas. Taking PO well. Denies SOB, Chest or Calf Pain. Using Incentive Spirometer, PAS in place. Ambulate 80', Patient reports pain as 3/10 .    Objective: Vital signs in last 24 hours: Vitals:   07/17/17 1648 07/17/17 2154 07/18/17 0150 07/18/17 0531  BP: 113/64 (Abnormal) 107/55 (Abnormal) 98/57 (Abnormal) 105/59  Pulse: 84 81 88 71  Resp: 16 15 16 15   Temp: 97.6 F (36.4 C) 97.7 F (36.5 C) 98.5 F (36.9 C) 98.3 F (36.8 C)  TempSrc: Oral Oral Oral Oral  SpO2: 97% 100% 98% 98%  Weight:      Height:          Intake/Output from previous day: I/O last 3 completed shifts: In: 3976.7 [P.O.:680; I.V.:3131.7; IV Piggyback:165] Out: 3600 [Urine:3300; Blood:300]   Intake/Output this shift: No intake/output data recorded.   LABORATORY DATA: Recent Labs    07/18/17 0515  WBC 10.1  HGB 9.5*  HCT 29.7*  PLT 186  NA 140  K 4.3  CL 105  CO2 29  BUN 14  CREATININE 0.59  GLUCOSE 134*  CALCIUM 8.7*    Examination: Neurologically intact ABD soft Neurovascular intact Sensation intact distally Intact pulses distally Dorsiflexion/Plantar flexion intact Incision: scant drainage No cellulitis present Compartment soft}  Assessment:   1 Day Post-Op Procedure(s) (LRB): LEFT TOTAL KNEE ARTHROPLASTY (Left) ADDITIONAL DIAGNOSIS: Expected Acute Blood Loss Anemia,  Anticipated LOS equal to or greater than 2 midnights due to - Age 66 and older with one or more of the following:  - Obesity  - Expected need for hospital services (PT, OT, Nursing) required for safe  discharge  - Anticipated need for postoperative skilled nursing care or inpatient rehab  - Patient is a high risk of re-admission due to: Barriers to post-acute care (logistical,  no family support in home) Husband has history of traumatic brain injury and will supply a very limited help    Plan: PT/OT WBAT, AROM and PROM  DVT Prophylaxis:  SCDx72hrs, ASA 325 mg BID x 2 weeks DISCHARGE PLAN: Skilled Nursing Facility/Rehab,Wendy Clark DISCHARGE NEEDS: HHPT, Walker and 3-in-1 comode seat     Wendy LewandowskyFrank J Zonie Clark 07/18/2017, 7:19 AM

## 2017-07-18 NOTE — Anesthesia Postprocedure Evaluation (Addendum)
Anesthesia Post Note  Patient: Ferman HammingSusan T Sanger  Procedure(s) Performed: LEFT TOTAL KNEE ARTHROPLASTY (Left Knee)     Patient location during evaluation: PACU Anesthesia Type: Spinal and Regional Level of consciousness: oriented and awake and alert Pain management: pain level controlled Vital Signs Assessment: post-procedure vital signs reviewed and stable Respiratory status: spontaneous breathing, respiratory function stable and patient connected to nasal cannula oxygen Cardiovascular status: blood pressure returned to baseline and stable Postop Assessment: no headache, no backache, no apparent nausea or vomiting and spinal receding Anesthetic complications: no    Last Vitals:  Vitals:   07/18/17 0531 07/18/17 0913  BP: (!) 105/59 (!) 115/55  Pulse: 71 72  Resp: 15 14  Temp: 36.8 C (!) 36.3 C  SpO2: 98% 96%    Last Pain:  Vitals:   07/18/17 1219  TempSrc:   PainSc: 2                  Rahmah Mccamy P Jaydian Santana

## 2017-07-18 NOTE — Progress Notes (Addendum)
Physical Therapy Treatment Patient Details Name: Wendy HammingSusan T Baumler MRN: [id number] DOB: 01/24/1950 Today's Date: 07/18/2017    History of Present Illness LTKA    PT Comments    POD #1 assisted patient with supine to sit, sit to stand, and ambulation. Patient needed min assist to move affected LE during supine to sit. Patient ambulated 80 feet, min guard, initially with a step to pattern progressing to a step thorough pattern. After ambulation patient c/o nausea and vomited while sitting on EOB. RN was notified and once nausea subsided, patient did some of her exercises. Patient did not report any increased pain after PT.   Follow Up Recommendations  SNF     Equipment Recommendations  None recommended by PT    Recommendations for Other Services       Precautions / Restrictions Precautions Precautions: Fall;Knee Restrictions Weight Bearing Restrictions: No    Mobility  Bed Mobility Overal bed mobility: Needs Assistance Bed Mobility: Supine to Sit     Supine to sit: Min assist     General bed mobility comments: Assist to move L LE  Transfers Overall transfer level: Needs assistance Equipment used: Rolling walker (2 wheeled) Transfers: Sit to/from Stand Sit to Stand: Min guard            Ambulation/Gait Ambulation/Gait assistance: Min Chemical engineerassist Gait Distance (Feet): 80 Feet Assistive device: Rolling walker (2 wheeled) Gait Pattern/deviations: Step-to pattern;Step-through pattern     General Gait Details: LOB while turning; physcal assist needed to regain balance   Stairs             Wheelchair Mobility    Modified Rankin (Stroke Patients Only)       Balance                                            Cognition Arousal/Alertness: Awake/alert Behavior During Therapy: WFL for tasks assessed/performed Overall Cognitive Status: Within Functional Limits for tasks assessed                                         Exercises Total Joint Exercises Ankle Circles/Pumps: AROM;10 reps;Supine;Both Quad Sets: AROM;Left;Supine;10 reps Towel Squeeze: AROM;Both;10 reps;Supine Short Arc Quad: AROM;Left;Supine;10 reps Heel Slides: AROM;Left;10 reps;Supine Hip ABduction/ADduction: AROM;Left;10 reps;Supine Straight Leg Raises: AROM;Left;10 reps;Supine    General Comments        Pertinent Vitals/Pain Pain Assessment: 0-10 Pain Score: 4  Pain Location: LEFT  KNEE Pain Descriptors / Indicators: Aching;Sore Pain Intervention(s): Limited activity within patient's tolerance;Repositioned;Ice applied;Monitored during session    Home Living                      Prior Function            PT Goals (current goals can now be found in the care plan section) Progress towards PT goals: Progressing toward goals    Frequency    7X/week      PT Plan Current plan remains appropriate    Co-evaluation              AM-PAC PT "6 Clicks" Daily Activity  Outcome Measure  Difficulty turning over in bed (including adjusting bedclothes, sheets and blankets)?: A Little Difficulty moving from lying on back to sitting on the side of the bed? :  A Little Difficulty sitting down on and standing up from a chair with arms (e.g., wheelchair, bedside commode, etc,.)?: A Little Help needed moving to and from a bed to chair (including a wheelchair)?: A Little Help needed walking in hospital room?: A Little Help needed climbing 3-5 steps with a railing? : A Lot 6 Click Score: 17    End of Session Equipment Utilized During Treatment: Gait belt Activity Tolerance: Patient tolerated treatment well Patient left: in bed;with call bell/phone within reach   PT Visit Diagnosis: Unsteadiness on feet (R26.81);Pain Pain - Right/Left: Left Pain - part of body: Knee     Time: 4098-1191 PT Time Calculation (min) (ACUTE ONLY): 32 min  Charges:  $Gait Training: 8-22 mins $Therapeutic Exercise: 8-22 mins                     G Codes:       Randa Lynn, SPTA 07/18/2017, 3:49 PM  Direct Supervision during session and agree with above  Felecia Shelling  PTA [facility]  Acute  Rehab Pager      [phone]

## 2017-07-18 NOTE — Plan of Care (Signed)
Pt alert and oriented, having some nausea this am, feeling sleepy.  Pain well controlled.  RN will monitor.

## 2017-07-18 NOTE — [place] FL2 (Signed)
[place] MEDICAID FL2 LEVEL OF CARE SCREENING TOOL     IDENTIFICATION  Patient Name: Wendy Clark Birthdate: [birth year] Sex: female Admission Date (Current Location): 07/17/2017  [facility] and [place] Number:  Producer, television/film/video and Address:  [facility],  [address] [place] [address]      Provider Number: [id number]  Attending Physician Name and Address:  Gean Birchwood, MD  Relative Name and Phone Number:       Current Level of Care: Hospital Recommended Level of Care: Skilled Nursing Facility Prior Approval Number:    Date Approved/Denied:   PASRR Number: [id number]  Discharge Plan: SNF    Current Diagnoses: Patient Active Problem List   Diagnosis Date Noted  . Primary osteoarthritis of left knee 07/17/2017  . Degenerative arthritis of left knee 07/13/2017    Orientation RESPIRATION BLADDER Height & Weight     Self, Time, Situation, Place  Normal Continent Weight: 162 lb (73.5 kg) Height:  5\' 3"  (160 cm)  BEHAVIORAL SYMPTOMS/MOOD NEUROLOGICAL BOWEL NUTRITION STATUS      Continent (Low Sodium Heart Healthy. )  AMBULATORY STATUS COMMUNICATION OF NEEDS Skin   Extensive Assist Verbally Normal                       Personal Care Assistance Level of Assistance  Bathing, Feeding, Dressing Bathing Assistance: Limited assistance Feeding assistance: Independent Dressing Assistance: Limited assistance     Functional Limitations Info  Sight, Hearing, Speech Sight Info: Adequate Hearing Info: Adequate Speech Info: Adequate    SPECIAL CARE FACTORS FREQUENCY  PT (By licensed PT), OT (By licensed OT)     PT Frequency: 7x/week  OT Frequency: 7x/week             Contractures Contractures Info: Not present    Additional Factors Info  Code Status, Allergies, Psychotropic Code Status Info: Fullcode Allergies Info: Allergies: Novocain Procaine Psychotropic Info: Prozac         Current Medications (07/18/2017):  This is the  current hospital active medication list Current Facility-Administered Medications  Medication Dose Route Frequency Provider Last Rate Last Dose  . acetaminophen (TYLENOL) tablet 325-650 mg  325-650 mg Oral Q6H PRN Gean Birchwood, MD      . alum & mag hydroxide-simeth (MAALOX/MYLANTA) 200-200-20 MG/5ML suspension 30 mL  30 mL Oral Q4H PRN Gean Birchwood, MD      . aspirin chewable tablet 81 mg  81 mg Oral BID Gean Birchwood, MD   81 mg at 07/18/17 0903  . bisacodyl (DULCOLAX) EC tablet 5 mg  5 mg Oral Daily PRN Gean Birchwood, MD      . calcium-vitamin D (OSCAL WITH D) 500-200 MG-UNIT per tablet   Oral BID Gean Birchwood, MD   1 tablet at 07/18/17 [phone]  . cholecalciferol (VITAMIN D) tablet 1,000 Units  1,000 Units Oral BID Gean Birchwood, MD   1,000 Units at 07/18/17 0904  . dextrose 5 % and 0.45 % NaCl with KCl 20 mEq/L infusion   Intravenous Continuous Gean Birchwood, MD 125 mL/hr at 07/18/17 0711 1 mL at 07/18/17 0711  . diclofenac sodium (VOLTAREN) 1 % transdermal gel 1 application  1 application Topical PRN Gean Birchwood, MD      . diphenhydrAMINE (BENADRYL) 12.5 MG/5ML elixir 12.5-25 mg  12.5-25 mg Oral Q4H PRN Gean Birchwood, MD      . docusate sodium (COLACE) capsule 100 mg  100 mg Oral BID Gean Birchwood, MD   100 mg at  07/18/17 0903  . [START ON 07/20/2017] fluconazole (DIFLUCAN) tablet 150 mg  150 mg Oral Q Lacie Scottshu Rowan, Frank, MD      . FLUoxetine (PROZAC) capsule 20 mg  20 mg Oral Daily Gean Birchwoodowan, Frank, MD   20 mg at 07/18/17 0904  . gabapentin (NEURONTIN) capsule 300 mg  300 mg Oral TID Gean Birchwoodowan, Frank, MD   300 mg at 07/18/17 0904  . HYDROmorphone (DILAUDID) injection 0.5-1 mg  0.5-1 mg Intravenous Q4H PRN Gean Birchwoodowan, Frank, MD      . loratadine (CLARITIN) tablet 10 mg  10 mg Oral Daily PRN Gean Birchwoodowan, Frank, MD      . menthol-cetylpyridinium (CEPACOL) lozenge 3 mg  1 lozenge Oral PRN Gean Birchwoodowan, Frank, MD       Or  . phenol (CHLORASEPTIC) mouth spray 1 spray  1 spray Mouth/Throat PRN Gean Birchwoodowan, Frank, MD      . methocarbamol  (ROBAXIN) tablet 500 mg  500 mg Oral Q6H PRN Gean Birchwoodowan, Frank, MD   500 mg at 07/17/17 1956   Or  . methocarbamol (ROBAXIN) 500 mg in dextrose 5 % 50 mL IVPB  500 mg Intravenous Q6H PRN Gean Birchwoodowan, Frank, MD   Stopped at 07/17/17 1328  . metoCLOPramide (REGLAN) tablet 5-10 mg  5-10 mg Oral Q8H PRN Gean Birchwoodowan, Frank, MD       Or  . metoCLOPramide (REGLAN) injection 5-10 mg  5-10 mg Intravenous Q8H PRN Gean Birchwoodowan, Frank, MD   5 mg at 07/18/17 1000  . ondansetron (ZOFRAN) tablet 4 mg  4 mg Oral Q6H PRN Gean Birchwoodowan, Frank, MD       Or  . ondansetron [facility](ZOFRAN) injection 4 mg  4 mg Intravenous Q6H PRN Gean Birchwoodowan, Frank, MD   4 mg at 07/18/17 0903  . oxyCODONE (Oxy IR/ROXICODONE) immediate release tablet 5-10 mg  5-10 mg Oral Q4H PRN Gean Birchwoodowan, Frank, MD   5 mg at 07/18/17 1034  . pantoprazole (PROTONIX) EC tablet 40 mg  40 mg Oral Daily Gean Birchwoodowan, Frank, MD   40 mg at 07/18/17 0904  . polyethylene glycol (MIRALAX / GLYCOLAX) packet 17 g  17 g Oral Daily PRN Gean Birchwoodowan, Frank, MD      . sodium phosphate (FLEET) 7-19 GM/118ML enema 1 enema  1 enema Rectal Once PRN Gean Birchwoodowan, Frank, MD      . vitamin B-12 (CYANOCOBALAMIN) tablet 100 mcg  100 mcg Oral Daily Gean Birchwoodowan, Frank, MD   100 mcg at 07/18/17 [id number]     Discharge Medications: Please see discharge summary for a list of discharge medications.  Relevant Imaging Results:  Relevant Lab Results:   Additional Information RUE:[id number]:[id number]  Clearance CootsNicole A Kirstie Larsen, LCSW

## 2017-07-18 NOTE — Discharge Summary (Addendum)
Patient ID: Wendy Clark MRN: [id number] DOB/AGE: [birth year] 67 y.o.  Admit date: 07/17/2017 Discharge date: 07/19/2017  Admission Diagnoses:  Principal Problem:   Degenerative arthritis of left knee Active Problems:   Primary osteoarthritis of left knee   Discharge Diagnoses:  Same  Past Medical History:  Diagnosis Date  . Allergic rhinitis   . Arthritis    oa  . Headache    occ stress HA   . Osteoporosis   . Tremors of nervous system   . Urinary urgency     Surgeries: Procedure(s): LEFT TOTAL KNEE ARTHROPLASTY on 07/17/2017   Consultants:   Discharged Condition: Improved  Hospital Course: Wendy Clark is an 67 y.o. female who was admitted 07/17/2017 for operative treatment ofDegenerative arthritis of left knee. Patient has severe unremitting pain that affects sleep, daily activities, and work/hobbies. After pre-op clearance the patient was taken to the operating room on 07/17/2017 and underwent  Procedure(s): LEFT TOTAL KNEE ARTHROPLASTY.    Patient was given perioperative antibiotics:  Anti-infectives (From admission, onward)   Start     Dose/Rate Route Frequency Ordered Stop   07/20/17 1000  fluconazole (DIFLUCAN) tablet 150 mg     150 mg Oral Every Thu 07/17/17 1331     07/17/17 0930  ceFAZolin (ANCEF) IVPB 2g/100 mL premix     2 g 200 mL/hr over 30 Minutes Intravenous On call to O.R. 07/17/17 0917 07/17/17 1031       Patient was given sequential compression devices, early ambulation, and chemoprophylaxis to prevent DVT.  Patient benefited maximally from hospital stay and there were no complications.    Recent vital signs:  Patient Vitals for the past 24 hrs:  BP Temp Temp src Pulse Resp SpO2  07/18/17 0913 (!) 115/55 (!) 97.4 F (36.3 C) Oral 72 14 96 %  07/18/17 0531 (!) 105/59 98.3 F (36.8 C) Oral 71 15 98 %  07/18/17 0150 (!) 98/57 98.5 F (36.9 C) Oral 88 16 98 %  07/17/17 2154 (!) 107/55 97.7 F (36.5 C) Oral 81 15 100 %  07/17/17 1648 113/64  97.6 F (36.4 C) Oral 84 16 97 %  07/17/17 1546 118/65 (!) 97.5 F (36.4 C) Oral 86 14 100 %  07/17/17 1445 121/69 97.8 F (36.6 C) Oral 75 14 100 %  07/17/17 1336 121/65 (!) 97.5 F (36.4 C) Oral 75 16 100 %  07/17/17 1315 112/60 97.8 F (36.6 C) - 66 12 100 %  07/17/17 1300 (!) 101/48 - - 64 14 100 %  07/17/17 1245 106/65 - - 63 12 98 %  07/17/17 1230 (!) 91/55 - - 75 12 97 %  07/17/17 1225 91/64 97.9 F (36.6 C) - 75 10 98 %     Recent laboratory studies:  Recent Labs    07/18/17 0515  WBC 10.1  HGB 9.5*  HCT 29.7*  PLT 186  NA 140  K 4.3  CL 105  CO2 29  BUN 14  CREATININE 0.59  GLUCOSE 134*  CALCIUM 8.7*     Discharge Medications:   Allergies as of 07/18/2017      Reactions   Novocain [procaine]    pt stated, "I get faint, pass out, light-headed"      Medication List    STOP taking these medications   diclofenac sodium 1 % Gel Commonly known as:  VOLTAREN   naproxen sodium 220 MG tablet Commonly known as:  ALEVE     TAKE these medications   aspirin  EC 81 MG tablet Take 1 tablet (81 mg total) by mouth 2 (two) times daily. What changed:    medication strength  how much to take  when to take this  reasons to take this   CALCIUM-D PO Take 1 tablet by mouth 2 (two) times daily.   cholecalciferol 1000 units tablet Commonly known as:  VITAMIN D Take 1,000 Units by mouth 2 (two) times daily.   CLARITIN 10 MG tablet Generic drug:  loratadine Take 10 mg by mouth daily as needed for allergies.   Fish Oil 1200 MG Caps Take 1,200 mg by mouth every evening.   fluconazole 150 MG tablet Commonly known as:  DIFLUCAN Take 150 mg by mouth every Thursday.   FLUoxetine 20 MG capsule Commonly known as:  PROZAC Take 20 mg by mouth daily.   oxyCODONE-acetaminophen 5-325 MG tablet Commonly known as:  PERCOCET/ROXICET Take 1 tablet by mouth every 4 (four) hours as needed for severe pain.   tiZANidine 2 MG tablet Commonly known as:  ZANAFLEX Take  1 tablet (2 mg total) by mouth every 6 (six) hours as needed.   vitamin B-12 100 MCG tablet Commonly known as:  CYANOCOBALAMIN Take 100 mcg by mouth daily.            Durable Medical Equipment  (From admission, onward)        Start     Ordered   07/17/17 1332  DME Walker rolling  Once    Question:  Patient needs a walker to treat with the following condition  Answer:  Status post total left knee replacement   07/17/17 1331   07/17/17 1332  DME 3 n 1  Once     07/17/17 1331       Discharge Care Instructions  (From admission, onward)        Start     Ordered   07/18/17 0000  Weight bearing as tolerated     07/18/17 1046      Diagnostic Studies: Dg Chest 2 View  Result Date: 07/07/2017 CLINICAL DATA:  67 year old female preoperative study. EXAM: CHEST - 2 VIEW COMPARISON:  None. FINDINGS: Normal cardiac size and mediastinal contours. Lung volumes are within normal limits. Visualized tracheal air column is within normal limits. Possible small calcified granuloma in the left mid lung (versus vessel artifact). Otherwise both lungs appear clear with no pneumothorax or pleural effusion. Mild spinal scoliosis. No acute osseous abnormality identified. Negative visible bowel gas pattern. IMPRESSION: Negative chest; possible small postinflammatory calcified granuloma in the left lung. Electronically Signed   By: Odessa FlemingH  Hall M.D.   On: 07/07/2017 14:54    Disposition: Discharge disposition: 03-Skilled Nursing Facility       Discharge Instructions    Call MD / Call 911   Complete by:  As directed    If you experience chest pain or shortness of breath, CALL 911 and be transported to the hospital emergency room.  If you develope a fever above 101 F, pus (white drainage) or increased drainage or redness at the wound, or calf pain, call your surgeon's office.   Constipation Prevention   Complete by:  As directed    Drink plenty of fluids.  Prune juice may be helpful.  You may use a  stool softener, such as Colace (over the counter) 100 mg twice a day.  Use MiraLax (over the counter) for constipation as needed.   Diet - low sodium heart healthy   Complete by:  As directed  Driving restrictions   Complete by:  As directed    No driving for 2 weeks   Increase activity slowly as tolerated   Complete by:  As directed    Patient may shower   Complete by:  As directed    You may shower without a dressing once there is no drainage.  Do not wash over the wound.  If drainage remains, cover wound with plastic wrap and then shower.   Weight bearing as tolerated   Complete by:  As directed       Follow-up Information    Gean Birchwood, MD In 2 weeks.   Specialty:  Orthopedic Surgery Contact information: [address] [place] [address] [phone]            Signed: Dannielle Burn 07/18/2017, 10:47 AM

## 2017-07-19 DIAGNOSIS — F339 Major depressive disorder, recurrent, unspecified: Secondary | ICD-10-CM | POA: Diagnosis not present

## 2017-07-19 DIAGNOSIS — M810 Age-related osteoporosis without current pathological fracture: Secondary | ICD-10-CM | POA: Diagnosis not present

## 2017-07-19 DIAGNOSIS — R3915 Urgency of urination: Secondary | ICD-10-CM | POA: Diagnosis not present

## 2017-07-19 DIAGNOSIS — R51 Headache: Secondary | ICD-10-CM | POA: Diagnosis not present

## 2017-07-19 DIAGNOSIS — E669 Obesity, unspecified: Secondary | ICD-10-CM | POA: Diagnosis not present

## 2017-07-19 DIAGNOSIS — F329 Major depressive disorder, single episode, unspecified: Secondary | ICD-10-CM | POA: Diagnosis not present

## 2017-07-19 DIAGNOSIS — G2581 Restless legs syndrome: Secondary | ICD-10-CM | POA: Diagnosis not present

## 2017-07-19 DIAGNOSIS — Z96652 Presence of left artificial knee joint: Secondary | ICD-10-CM | POA: Diagnosis not present

## 2017-07-19 DIAGNOSIS — R251 Tremor, unspecified: Secondary | ICD-10-CM | POA: Diagnosis not present

## 2017-07-19 DIAGNOSIS — Z471 Aftercare following joint replacement surgery: Secondary | ICD-10-CM | POA: Diagnosis not present

## 2017-07-19 DIAGNOSIS — M1990 Unspecified osteoarthritis, unspecified site: Secondary | ICD-10-CM | POA: Diagnosis not present

## 2017-07-19 DIAGNOSIS — M1712 Unilateral primary osteoarthritis, left knee: Secondary | ICD-10-CM | POA: Diagnosis not present

## 2017-07-19 LAB — CBC
HCT: 26.5 % — ABNORMAL LOW (ref 36.0–46.0)
HEMOGLOBIN: 8.7 g/dL — AB (ref 12.0–15.0)
MCH: 30.6 pg (ref 26.0–34.0)
MCHC: 32.8 g/dL (ref 30.0–36.0)
MCV: 93.3 fL (ref 78.0–100.0)
Platelets: 191 10*3/uL (ref 150–400)
RBC: 2.84 MIL/uL — ABNORMAL LOW (ref 3.87–5.11)
RDW: 13.3 % (ref 11.5–15.5)
WBC: 10.1 10*3/uL (ref 4.0–10.5)

## 2017-07-19 NOTE — Progress Notes (Signed)
Report called to WorthingtonShenika, Charity fundraiserN at [facility].

## 2017-07-19 NOTE — Progress Notes (Signed)
Physical Therapy Treatment Patient Details Name: Wendy Clark MRN: [id number] DOB: [birth year] Today's Date: 07/19/2017    History of Present Illness LTKA    PT Comments    Pt continues motivated and progressing steadily with mobility.  Increased activity tolerance noted this date and no LOB with ambulation.   Follow Up Recommendations  SNF     Equipment Recommendations  None recommended by PT    Recommendations for Other Services       Precautions / Restrictions Precautions Precautions: Fall;Knee Restrictions Weight Bearing Restrictions: No    Mobility  Bed Mobility Overal bed mobility: Needs Assistance Bed Mobility: Supine to Sit;Sit to Supine     Supine to sit: Min assist Sit to supine: Min assist   General bed mobility comments: Assist to move L LE  Transfers Overall transfer level: Needs assistance Equipment used: Rolling walker (2 wheeled) Transfers: Sit to/from Stand Sit to Stand: Min guard         General transfer comment: cues for hand and left leg.  Ambulation/Gait Ambulation/Gait assistance: Min guard Gait Distance (Feet): 150 Feet(and 15' into bathroom) Assistive device: Rolling walker (2 wheeled) Gait Pattern/deviations: Step-to pattern;Step-through pattern;Shuffle Gait velocity: decr   General Gait Details: cues for posture, position from RW and sequence.  no LOB noted this am   Stairs             Wheelchair Mobility    Modified Rankin (Stroke Patients Only)       Balance                                            Cognition Arousal/Alertness: Awake/alert Behavior During Therapy: WFL for tasks assessed/performed Overall Cognitive Status: Within Functional Limits for tasks assessed                                        Exercises Total Joint Exercises Ankle Circles/Pumps: AROM;10 reps;Supine;Both;15 reps Quad Sets: AROM;Left;Supine;15 reps Heel Slides: AROM;Left;Supine;20 reps Hip  ABduction/ADduction: AROM;Left;10 reps;Supine Straight Leg Raises: AROM;Left;Supine;20 reps    General Comments        Pertinent Vitals/Pain Pain Assessment: 0-10 Pain Score: 3  Pain Location: LEFT  KNEE Pain Descriptors / Indicators: Aching;Sore Pain Intervention(s): Premedicated before session;Monitored during session;Limited activity within patient's tolerance;Ice applied    Home Living                      Prior Function            PT Goals (current goals can now be found in the care plan section) Acute Rehab PT Goals Patient Stated Goal: walk with out a limp PT Goal Formulation: With patient Time For Goal Achievement: 07/21/17 Potential to Achieve Goals: Good Progress towards PT goals: Progressing toward goals    Frequency    7X/week      PT Plan Current plan remains appropriate    Co-evaluation              AM-PAC PT "6 Clicks" Daily Activity  Outcome Measure  Difficulty turning over in bed (including adjusting bedclothes, sheets and blankets)?: A Little Difficulty moving from lying on back to sitting on the side of the bed? : A Little Difficulty sitting down on and standing up from a chair with  arms (e.g., wheelchair, bedside commode, etc,.)?: A Little Help needed moving to and from a bed to chair (including a wheelchair)?: A Little Help needed walking in hospital room?: A Little Help needed climbing 3-5 steps with a railing? : A Little 6 Click Score: 18    End of Session Equipment Utilized During Treatment: Gait belt Activity Tolerance: Patient tolerated treatment well Patient left: in bed;with call bell/phone within reach Nurse Communication: Mobility status PT Visit Diagnosis: Unsteadiness on feet (R26.81);Pain Pain - Right/Left: Left Pain - part of body: Knee     Time: [id number] PT Time Calculation (min) (ACUTE ONLY): 27 min  Charges:  $Gait Training: 8-22 mins $Therapeutic Exercise: 8-22 mins                    G Codes:        Pg [phone]    Clare Casto 07/19/2017, 10:11 AM

## 2017-07-19 NOTE — Progress Notes (Signed)
PATIENT ID: Wendy Clark  MRN: [id number]  DOB/AGE:  03/09/1950 / 67 y.o.  2 Days Post-Op Procedure(s) (LRB): LEFT TOTAL KNEE ARTHROPLASTY (Left)    PROGRESS NOTE Subjective: Patient is alert, oriented, no Nausea, no Vomiting, yes passing gas. Taking PO well. Denies SOB, Chest or Calf Pain. Using Incentive Spirometer, PAS in place. Ambulate WBAT with pt walking 80 ft , Patient reports pain as 2/10 .    Objective: Vital signs in last 24 hours: Vitals:   07/18/17 0913 07/18/17 1333 07/18/17 2030 07/19/17 0512  BP: (!) 115/55 (!) 108/54 (!) 119/56 129/62  Pulse: 72 68 75 92  Resp: 14 14 14 16   Temp: (!) 97.4 F (36.3 C) 97.9 F (36.6 C) 97.9 F (36.6 C) 97.6 F (36.4 C)  TempSrc: Oral Oral Oral Oral  SpO2: 96% 97% 98% 96%  Weight:      Height:          Intake/Output from previous day: I/O last 3 completed shifts: In: 4931.7 [P.O.:1640; I.V.:3291.7] Out: 4050 [Urine:4050]   Intake/Output this shift: No intake/output data recorded.   LABORATORY DATA: Recent Labs    07/18/17 0515 07/19/17 0507  WBC 10.1 10.1  HGB 9.5* 8.7*  HCT 29.7* 26.5*  PLT 186 191  NA 140  --   K 4.3  --   CL 105  --   CO2 29  --   BUN 14  --   CREATININE 0.59  --   GLUCOSE 134*  --   CALCIUM 8.7*  --     Examination: Neurologically intact Neurovascular intact Sensation intact distally Intact pulses distally Dorsiflexion/Plantar flexion intact Incision: dressing C/D/I and no drainage No cellulitis present Compartment soft}  Assessment:   2 Days Post-Op Procedure(s) (LRB): LEFT TOTAL KNEE ARTHROPLASTY (Left) ADDITIONAL DIAGNOSIS: Expected Acute Blood Loss Anemia,  Anticipated LOS equal to or greater than 2 midnights due to - Age 66 and older with one or more of the following:  - Obesity  - Expected need for hospital services (PT, OT, Nursing) required for safe  discharge  - Anticipated need for postoperative skilled nursing care or inpatient rehab - Patient is a high risk of  re-admission due to: Husband has history of traumatic brain injury and will supply a very limited help    Plan: PT/OT WBAT, AROM and PROM  DVT Prophylaxis:  SCDx72hrs, ASA 81 mg BID x 2 weeks DISCHARGE PLAN: Skilled Nursing Facility/Rehab DISCHARGE NEEDS: HHPT, Walker and 3-in-1 comode seat     Wendy Clark Wendy Clark 07/19/2017, 7:49 AM

## 2017-07-21 DIAGNOSIS — F339 Major depressive disorder, recurrent, unspecified: Secondary | ICD-10-CM | POA: Diagnosis not present

## 2017-07-21 DIAGNOSIS — M1712 Unilateral primary osteoarthritis, left knee: Secondary | ICD-10-CM | POA: Diagnosis not present

## 2017-07-21 DIAGNOSIS — Z471 Aftercare following joint replacement surgery: Secondary | ICD-10-CM | POA: Diagnosis not present

## 2017-07-21 DIAGNOSIS — G2581 Restless legs syndrome: Secondary | ICD-10-CM | POA: Diagnosis not present

## 2017-07-25 DIAGNOSIS — M1712 Unilateral primary osteoarthritis, left knee: Secondary | ICD-10-CM | POA: Diagnosis not present

## 2017-07-25 DIAGNOSIS — Z96652 Presence of left artificial knee joint: Secondary | ICD-10-CM | POA: Diagnosis not present

## 2017-07-25 DIAGNOSIS — Z471 Aftercare following joint replacement surgery: Secondary | ICD-10-CM | POA: Diagnosis not present

## 2017-07-27 DIAGNOSIS — Z96652 Presence of left artificial knee joint: Secondary | ICD-10-CM | POA: Diagnosis not present

## 2017-07-27 DIAGNOSIS — Z471 Aftercare following joint replacement surgery: Secondary | ICD-10-CM | POA: Diagnosis not present

## 2017-07-27 DIAGNOSIS — R600 Localized edema: Secondary | ICD-10-CM | POA: Diagnosis not present

## 2017-07-27 DIAGNOSIS — M25562 Pain in left knee: Secondary | ICD-10-CM | POA: Diagnosis not present

## 2017-08-02 DIAGNOSIS — Z471 Aftercare following joint replacement surgery: Secondary | ICD-10-CM | POA: Diagnosis not present

## 2017-08-02 DIAGNOSIS — Z96652 Presence of left artificial knee joint: Secondary | ICD-10-CM | POA: Diagnosis not present

## 2017-08-02 DIAGNOSIS — M25562 Pain in left knee: Secondary | ICD-10-CM | POA: Diagnosis not present

## 2017-08-02 DIAGNOSIS — R600 Localized edema: Secondary | ICD-10-CM | POA: Diagnosis not present

## 2017-08-04 DIAGNOSIS — Z96652 Presence of left artificial knee joint: Secondary | ICD-10-CM | POA: Diagnosis not present

## 2017-08-04 DIAGNOSIS — M25562 Pain in left knee: Secondary | ICD-10-CM | POA: Diagnosis not present

## 2017-08-04 DIAGNOSIS — Z471 Aftercare following joint replacement surgery: Secondary | ICD-10-CM | POA: Diagnosis not present

## 2017-08-04 DIAGNOSIS — R600 Localized edema: Secondary | ICD-10-CM | POA: Diagnosis not present

## 2017-08-07 DIAGNOSIS — R260 Ataxic gait: Secondary | ICD-10-CM | POA: Diagnosis not present

## 2017-08-07 DIAGNOSIS — Z96652 Presence of left artificial knee joint: Secondary | ICD-10-CM | POA: Diagnosis not present

## 2017-08-07 DIAGNOSIS — M25562 Pain in left knee: Secondary | ICD-10-CM | POA: Diagnosis not present

## 2017-08-09 DIAGNOSIS — M25562 Pain in left knee: Secondary | ICD-10-CM | POA: Diagnosis not present

## 2017-08-09 DIAGNOSIS — R600 Localized edema: Secondary | ICD-10-CM | POA: Diagnosis not present

## 2017-08-09 DIAGNOSIS — Z96652 Presence of left artificial knee joint: Secondary | ICD-10-CM | POA: Diagnosis not present

## 2017-08-10 DIAGNOSIS — Z96652 Presence of left artificial knee joint: Secondary | ICD-10-CM | POA: Diagnosis not present

## 2017-08-10 DIAGNOSIS — D649 Anemia, unspecified: Secondary | ICD-10-CM | POA: Diagnosis not present

## 2017-08-10 DIAGNOSIS — L853 Xerosis cutis: Secondary | ICD-10-CM | POA: Diagnosis not present

## 2017-08-14 DIAGNOSIS — Z96652 Presence of left artificial knee joint: Secondary | ICD-10-CM | POA: Diagnosis not present

## 2017-08-14 DIAGNOSIS — Z471 Aftercare following joint replacement surgery: Secondary | ICD-10-CM | POA: Diagnosis not present

## 2017-08-14 DIAGNOSIS — M25562 Pain in left knee: Secondary | ICD-10-CM | POA: Diagnosis not present

## 2017-08-14 DIAGNOSIS — R600 Localized edema: Secondary | ICD-10-CM | POA: Diagnosis not present

## 2017-08-17 DIAGNOSIS — R600 Localized edema: Secondary | ICD-10-CM | POA: Diagnosis not present

## 2017-08-17 DIAGNOSIS — Z96652 Presence of left artificial knee joint: Secondary | ICD-10-CM | POA: Diagnosis not present

## 2017-08-17 DIAGNOSIS — M25562 Pain in left knee: Secondary | ICD-10-CM | POA: Diagnosis not present

## 2017-08-17 DIAGNOSIS — Z471 Aftercare following joint replacement surgery: Secondary | ICD-10-CM | POA: Diagnosis not present

## 2017-08-21 DIAGNOSIS — R600 Localized edema: Secondary | ICD-10-CM | POA: Diagnosis not present

## 2017-08-21 DIAGNOSIS — M25562 Pain in left knee: Secondary | ICD-10-CM | POA: Diagnosis not present

## 2017-08-21 DIAGNOSIS — Z96652 Presence of left artificial knee joint: Secondary | ICD-10-CM | POA: Diagnosis not present

## 2017-08-21 DIAGNOSIS — Z471 Aftercare following joint replacement surgery: Secondary | ICD-10-CM | POA: Diagnosis not present

## 2017-08-23 DIAGNOSIS — Z471 Aftercare following joint replacement surgery: Secondary | ICD-10-CM | POA: Diagnosis not present

## 2017-08-23 DIAGNOSIS — M25562 Pain in left knee: Secondary | ICD-10-CM | POA: Diagnosis not present

## 2017-08-23 DIAGNOSIS — Z96652 Presence of left artificial knee joint: Secondary | ICD-10-CM | POA: Diagnosis not present

## 2017-08-23 DIAGNOSIS — R600 Localized edema: Secondary | ICD-10-CM | POA: Diagnosis not present

## 2017-08-29 DIAGNOSIS — Z96652 Presence of left artificial knee joint: Secondary | ICD-10-CM | POA: Diagnosis not present

## 2017-08-29 DIAGNOSIS — Z471 Aftercare following joint replacement surgery: Secondary | ICD-10-CM | POA: Diagnosis not present

## 2017-08-29 DIAGNOSIS — M25562 Pain in left knee: Secondary | ICD-10-CM | POA: Diagnosis not present

## 2017-08-29 DIAGNOSIS — R600 Localized edema: Secondary | ICD-10-CM | POA: Diagnosis not present

## 2017-09-04 DIAGNOSIS — L603 Nail dystrophy: Secondary | ICD-10-CM | POA: Diagnosis not present

## 2017-09-06 DIAGNOSIS — M25562 Pain in left knee: Secondary | ICD-10-CM | POA: Diagnosis not present

## 2017-09-06 DIAGNOSIS — R600 Localized edema: Secondary | ICD-10-CM | POA: Diagnosis not present

## 2017-09-06 DIAGNOSIS — Z471 Aftercare following joint replacement surgery: Secondary | ICD-10-CM | POA: Diagnosis not present

## 2017-09-06 DIAGNOSIS — Z96652 Presence of left artificial knee joint: Secondary | ICD-10-CM | POA: Diagnosis not present

## 2017-09-08 DIAGNOSIS — R413 Other amnesia: Secondary | ICD-10-CM | POA: Diagnosis not present

## 2017-09-08 DIAGNOSIS — L570 Actinic keratosis: Secondary | ICD-10-CM | POA: Diagnosis not present

## 2017-09-08 DIAGNOSIS — R531 Weakness: Secondary | ICD-10-CM | POA: Diagnosis not present

## 2017-09-08 DIAGNOSIS — R05 Cough: Secondary | ICD-10-CM | POA: Diagnosis not present

## 2017-09-13 DIAGNOSIS — Z96652 Presence of left artificial knee joint: Secondary | ICD-10-CM | POA: Diagnosis not present

## 2017-09-13 DIAGNOSIS — R600 Localized edema: Secondary | ICD-10-CM | POA: Diagnosis not present

## 2017-09-13 DIAGNOSIS — M25562 Pain in left knee: Secondary | ICD-10-CM | POA: Diagnosis not present

## 2017-09-13 DIAGNOSIS — Z471 Aftercare following joint replacement surgery: Secondary | ICD-10-CM | POA: Diagnosis not present

## 2017-09-19 ENCOUNTER — Emergency Department (HOSPITAL_COMMUNITY)
Admission: EM | Admit: 2017-09-19 | Discharge: 2017-09-19 | Disposition: A | Discharge status: Home / Self Care | Payer: Medicare Other | Attending: Emergency Medicine | Admitting: Emergency Medicine

## 2017-09-19 ENCOUNTER — Emergency Department (HOSPITAL_COMMUNITY): Payer: Medicare Other

## 2017-09-19 ENCOUNTER — Encounter (HOSPITAL_COMMUNITY): Payer: Self-pay | Admitting: Emergency Medicine

## 2017-09-19 DIAGNOSIS — R531 Weakness: Secondary | ICD-10-CM | POA: Insufficient documentation

## 2017-09-19 DIAGNOSIS — M4802 Spinal stenosis, cervical region: Secondary | ICD-10-CM | POA: Diagnosis not present

## 2017-09-19 DIAGNOSIS — Z79899 Other long term (current) drug therapy: Secondary | ICD-10-CM | POA: Diagnosis not present

## 2017-09-19 DIAGNOSIS — Z87891 Personal history of nicotine dependence: Secondary | ICD-10-CM | POA: Diagnosis not present

## 2017-09-19 DIAGNOSIS — Z96652 Presence of left artificial knee joint: Secondary | ICD-10-CM | POA: Diagnosis not present

## 2017-09-19 DIAGNOSIS — R29898 Other symptoms and signs involving the musculoskeletal system: Secondary | ICD-10-CM

## 2017-09-19 DIAGNOSIS — M6281 Muscle weakness (generalized): Secondary | ICD-10-CM | POA: Diagnosis not present

## 2017-09-19 LAB — I-STAT CHEM 8, ED
BUN: 20 mg/dL (ref 8–23)
CALCIUM ION: 1.19 mmol/L (ref 1.15–1.40)
Chloride: 103 mmol/L (ref 98–111)
Creatinine, Ser: 0.7 mg/dL (ref 0.44–1.00)
GLUCOSE: 96 mg/dL (ref 70–99)
HCT: 38 % (ref 36.0–46.0)
HEMOGLOBIN: 12.9 g/dL (ref 12.0–15.0)
POTASSIUM: 4.2 mmol/L (ref 3.5–5.1)
SODIUM: 138 mmol/L (ref 135–145)
TCO2: 27 mmol/L (ref 22–32)

## 2017-09-19 LAB — COMPREHENSIVE METABOLIC PANEL
ALT: 22 U/L (ref 0–44)
AST: 28 U/L (ref 15–41)
Albumin: 3.7 g/dL (ref 3.5–5.0)
Alkaline Phosphatase: 72 U/L (ref 38–126)
Anion gap: 9 (ref 5–15)
BUN: 17 mg/dL (ref 8–23)
CHLORIDE: 103 mmol/L (ref 98–111)
CO2: 28 mmol/L (ref 22–32)
Calcium: 9.5 mg/dL (ref 8.9–10.3)
Creatinine, Ser: 0.66 mg/dL (ref 0.44–1.00)
GFR calc Af Amer: >60 mL/min (ref >60)
Glucose, Bld: 96 mg/dL (ref 70–99)
POTASSIUM: 4.5 mmol/L (ref 3.5–5.1)
Sodium: 140 mmol/L (ref 135–145)
Total Bilirubin: 0.4 mg/dL (ref 0.3–1.2)
Total Protein: 6.4 g/dL — ABNORMAL LOW (ref 6.5–8.1)

## 2017-09-19 LAB — DIFFERENTIAL
ABS IMMATURE GRANULOCYTES: 0 10*3/uL (ref 0.0–0.1)
Basophils Absolute: 0 10*3/uL (ref 0.0–0.1)
Basophils Relative: 0 %
Eosinophils Absolute: 0.1 10*3/uL (ref 0.0–0.7)
Eosinophils Relative: 3 %
Immature Granulocytes: 0 %
LYMPHS ABS: 1.1 10*3/uL (ref 0.7–4.0)
LYMPHS PCT: 21 %
MONO ABS: 0.4 10*3/uL (ref 0.1–1.0)
MONOS PCT: 8 %
NEUTROS ABS: 3.6 10*3/uL (ref 1.7–7.7)
Neutrophils Relative %: 68 %

## 2017-09-19 LAB — CBC
HCT: 39.2 % (ref 36.0–46.0)
HEMOGLOBIN: 11.8 g/dL — AB (ref 12.0–15.0)
MCH: 28 pg (ref 26.0–34.0)
MCHC: 30.1 g/dL (ref 30.0–36.0)
MCV: 92.9 fL (ref 78.0–100.0)
Platelets: 257 10*3/uL (ref 150–400)
RBC: 4.22 MIL/uL (ref 3.87–5.11)
RDW: 12.8 % (ref 11.5–15.5)
WBC: 5.3 10*3/uL (ref 4.0–10.5)

## 2017-09-19 LAB — CBG MONITORING, ED: Glucose-Capillary: 141 mg/dL — ABNORMAL HIGH (ref 70–99)

## 2017-09-19 LAB — PROTIME-INR
INR: 0.95
Prothrombin Time: 12.6 seconds (ref 11.4–15.2)

## 2017-09-19 LAB — I-STAT TROPONIN, ED: TROPONIN I, POC: 0.01 ng/mL (ref 0.00–0.08)

## 2017-09-19 LAB — APTT: aPTT: 32 seconds (ref 24–36)

## 2017-09-19 MED ORDER — GADOBENATE DIMEGLUMINE 529 MG/ML IV SOLN
15.0000 mL | Freq: Once | INTRAVENOUS | Status: AC
  Administered 2017-09-19: 15 mL via INTRAVENOUS

## 2017-09-19 NOTE — ED Notes (Signed)
Patient ambulated to bathroom, steady gait noted.

## 2017-09-19 NOTE — ED Notes (Signed)
Pt CBG 141

## 2017-09-19 NOTE — ED Triage Notes (Signed)
Patient to ED c/o extremity weakness x 4, changes in her handwriting and motor skills (pinching and gripping items), and "delay in sending commands to her body" over the past month. She reports knee replacement back in July and having been on pain medication, but denies neurological changes like this before. Patient A&O x 4, no drift noted all extremities, grip strength equal, no facial droop, no speech or vision changes.

## 2017-09-19 NOTE — ED Notes (Signed)
Pt may eat or drink per dr. Fredderick Phenix

## 2017-09-19 NOTE — ED Notes (Signed)
Patient verbalizes understanding of medications and discharge instructions. No further questions at this time. VSS and patient ambulatory at discharge.   

## 2017-09-19 NOTE — ED Provider Notes (Signed)
[facility] EMERGENCY DEPARTMENT Provider Note   CSN: [id number] Arrival date & time: 09/19/17  1345     History   Chief Complaint Chief Complaint  Patient presents with  . Weakness    HPI Wendy Clark is a 67 y.o. female.  Patient is a 67 year old female who presents with weakness.  She had a knee replacement in July.  She states she was feeling good after the knee replacement but sometime within the last 3 to 4 weeks she started having a decline.  She is noticed that she has weakness in both of her arms.  She has difficulty with grip strength and cannot do things that she previously could do such as grab plates or shift her car.  She also has noticed that her handwriting is messy year.  She has had several falls because she feels off balance.  She also feels like her speech is slower than normal but she has not had feedback on that from other people around her.  She denies any headache.  No vision changes.  She has had some bruising from the falls but denies any current injuries.  She has some bruising around her right eye from 1 of the falls.  She states she fell 3 times yesterday.  She denies any neck or back pain.  Denies any history of similar symptoms.  She did see her PCP 2 weeks ago for the symptoms and was given a neurology referral but could not be seen by neurology until October.  She went to her orthopedist for a follow-up visit and he suggested that she come here for evaluation.  Her knee is doing well.  She does not note any trouble with her leg regarding her knee replacement.     Past Medical History:  Diagnosis Date  . Allergic rhinitis   . Arthritis    oa  . Headache    occ stress HA   . Osteoporosis   . Tremors of nervous system   . Urinary urgency     Patient Active Problem List   Diagnosis Date Noted  . Primary osteoarthritis of left knee 07/17/2017  . Degenerative arthritis of left knee 07/13/2017    Past Surgical History:    Procedure Laterality Date  . NO PAST SURGERIES    . 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 Authorizing Provider  Calcium Carbonate-Vitamin D (CALCIUM-D PO) Take 1 tablet by mouth 2 (two) times daily.   Yes [provider]  cholecalciferol (VITAMIN D) 1000 units tablet Take 1,000 Units by mouth 2 (two) times daily.   Yes [provider]  ferrous sulfate 325 (65 FE) MG tablet Take 325 mg by mouth daily with breakfast.   Yes [provider]  FLUoxetine (PROZAC) 20 MG capsule Take 20 mg by mouth daily.   Yes [provider]  loratadine (CLARITIN) 10 MG tablet Take 10 mg by mouth daily as needed for allergies.    Yes [provider]  Omega-3 Fatty Acids (FISH OIL) 1200 MG CAPS Take 1,200 mg by mouth every evening.    Yes [provider]  aspirin EC 81 MG tablet Take 1 tablet (81 mg total)  by mouth 2 (two) times daily. Patient not taking: Reported on 09/19/2017 07/17/17   Allena Katz, PA-C  oxyCODONE-acetaminophen (PERCOCET/ROXICET) 5-325 MG tablet Take 1 tablet by mouth every 4 (four) hours as needed for severe pain. Patient not taking: Reported on 09/19/2017 07/17/17   Allena Katz, PA-C  tiZANidine (ZANAFLEX) 2 MG tablet Take 1 tablet (2 mg total) by mouth every 6 (six) hours as needed. Patient not taking: Reported on 09/19/2017 07/17/17   Allena Katz, PA-C    Family History No family history on file.  Social History Social History   Tobacco Use  . Smoking status: Former Smoker    Years: 15.00    Types: Cigarettes  . Smokeless tobacco: Never Used  Substance Use Topics  . Alcohol use: Yes    Comment: 2 glasses red wine 1-2 times a month socially   . Drug use: Not  Currently    Comment: unsure of quit date but states i havent smoked ina while      Allergies   Novocain [procaine]   Review of Systems Review of Systems  Constitutional: Negative for chills, diaphoresis, fatigue and fever.  HENT: Negative for congestion, rhinorrhea and sneezing.   Eyes: Negative.   Respiratory: Negative for cough, chest tightness and shortness of breath.   Cardiovascular: Negative for chest pain and leg swelling.  Gastrointestinal: Negative for abdominal pain, blood in stool, diarrhea, nausea and vomiting.  Genitourinary: Negative for difficulty urinating, flank pain, frequency and hematuria.  Musculoskeletal: Negative for arthralgias and back pain.  Skin: Negative for rash.  Neurological: Positive for speech difficulty and weakness. Negative for dizziness, numbness and headaches.     Physical Exam Updated Vital Signs BP 119/74   Pulse 80   Temp 98.6 F (37 C) (Oral)   Resp 13   SpO2 99%   Physical Exam  Constitutional: She is oriented to person, place, and time. She appears well-developed and well-nourished.  HENT:  Head: Normocephalic and atraumatic.  Eyes: Pupils are equal, round, and reactive to light.  Neck: Normal range of motion. Neck supple.  No pain along the spine  Cardiovascular: Normal rate, regular rhythm and normal heart sounds.  Pulmonary/Chest: Effort normal and breath sounds normal. No respiratory distress. She has no wheezes. She has no rales. She exhibits no tenderness.  Abdominal: Soft. Bowel sounds are normal. There is no tenderness. There is no rebound and no guarding.  Musculoskeletal: Normal range of motion. She exhibits no edema.  Well-healing incision to her left anterior knee  Lymphadenopathy:    She has no cervical adenopathy.  Neurological: She is alert and oriented to person, place, and time.  Motor 5/5 all extremities Sensation grossly intact to LT all extremities Finger to Nose intact, no pronator drift CN II-XII  grossly intact No visual field deficits.  Patient has intact and hyperreflexic patellar reflexes   Skin: Skin is warm and dry. No rash noted.  Psychiatric: She has a normal mood and affect.     ED Treatments / Results  Labs (all labs ordered are listed, but only abnormal results are displayed) Labs Reviewed  CBC - Abnormal; Notable for the following components:      Result Value   Hemoglobin 11.8 (*)    All other components within normal limits  COMPREHENSIVE METABOLIC PANEL - Abnormal; Notable for the following components:   Total Protein 6.4 (*)    All other components within normal limits  CBG MONITORING, ED - Abnormal; Notable for the following  components:   Glucose-Capillary 141 (*)    All other components within normal limits  PROTIME-INR  APTT  DIFFERENTIAL  I-STAT TROPONIN, ED  I-STAT CHEM 8, ED    EKG EKG Interpretation  Date/Time:  Tuesday September 19 2017 13:59:20 EDT Ventricular Rate:  89 PR Interval:  156 QRS Duration: 68 QT Interval:  350 QTC Calculation: 425 R Axis:   63 Text Interpretation:  Normal sinus rhythm Right atrial enlargement Borderline ECG Confirmed by Rolan Bucco [phone]) on 09/19/2017 6:47:43 PM   Radiology Ct Head Wo Contrast  Result Date: 09/19/2017 CLINICAL DATA:  Extremity weakness.  No facial droop. EXAM: CT HEAD WITHOUT CONTRAST TECHNIQUE: Contiguous axial images were obtained from the base of the skull through the vertex without intravenous contrast. COMPARISON:  None. FINDINGS: Brain: No evidence of acute infarction, hemorrhage, extra-axial collection, ventriculomegaly, or mass effect. Generalized cerebral atrophy. Vascular: Cerebrovascular atherosclerotic calcifications are noted. Skull: Negative for fracture or focal lesion. Sinuses/Orbits: Visualized portions of the orbits are unremarkable. Visualized portions of the paranasal sinuses and mastoid air cells are unremarkable. Other: None. IMPRESSION: No acute intracranial pathology.  Electronically Signed   By: Elige Ko   On: 09/19/2017 15:10   Mr Brain Wo Contrast  Result Date: 09/19/2017 CLINICAL DATA:  67 y/o  F; 4 extremity weakness over the past month. EXAM: MRI HEAD WITHOUT CONTRAST MRI CERVICAL SPINE WITHOUT AND WITH CONTRAST TECHNIQUE: Multiplanar, multiecho pulse sequences of the cervical spine, to include the craniocervical junction and cervicothoracic junction, were obtained without and with intravenous contrast. Multiplanar, multiecho pulse sequences of the brain and surrounding structures were obtained without intravenous contrast. CONTRAST:  36mL MULTIHANCE GADOBENATE DIMEGLUMINE 529 MG/ML IV SOLN COMPARISON:  09/19/2017 CT head. FINDINGS: MRI HEAD FINDINGS Brain: No acute infarction, hemorrhage, hydrocephalus, extra-axial collection or mass lesion. Single punctate white matter hyperintensity within the left external capsule of unlikely significance given age. No lesion is present within juxta cortical white matter, corpus callosum, brainstem, or cerebellum. No structural abnormality of the brain. Mild age-appropriate volume loss of the brain. Vascular: Normal flow voids. Skull and upper cervical spine: Normal marrow signal. Sinuses/Orbits: Negative. Other: None. MRI CERVICAL SPINE FINDINGS Alignment: Mild reversal of cervical curvature at C5. C4-5 minimal grade 1 anterolisthesis. Vertebrae: No fracture, evidence of discitis, or bone lesion. No abnormal enhancement. Cord: Normal signal and morphology.  No abnormal enhancement. Posterior Fossa, vertebral arteries, paraspinal tissues: Negative. Disc levels: C2-3: Right-sided facet fusion and facet hypertrophy with mild right foraminal stenosis. C3-4: Left-sided uncovertebral and facet hypertrophy with moderate left neural foraminal stenosis. Trace left facet effusion. C4-5: Disc osteophyte complex eccentric to the right with right greater than left uncovertebral and facet hypertrophy. Moderate right foraminal stenosis, ventral  thecal sac effacement, and disc contact on the anterior cord. No significant canal stenosis. C5-6: Disc osteophyte complex with bilateral uncovertebral and facet hypertrophy. Severe bilateral foraminal stenosis and mild canal stenosis. Disc contact on the anterior cord with minimal anterior cord flattening. C6-7: Disc osteophyte complex with bilateral uncovertebral and facet hypertrophy. Mild bilateral foraminal stenosis. No significant canal stenosis. C7-T1: No significant disc displacement, foraminal stenosis, or canal stenosis. Mild bilateral facet hypertrophy. IMPRESSION: MRI head: No acute intracranial abnormality identified. Unremarkable MRI of the brain for age. MRI cervical spine. 1. No acute osseous or cord signal abnormality. 2. Mild reversal of cervical curvature and minimal C4-5 grade 1 anterolisthesis. 3. Cervical spondylosis predominantly at the C4-5 and C5-6 levels. 4. Moderate left C3-4, moderate right C4-5, and severe bilateral C5-6  foraminal stenosis. 5. Mild C5-6 canal stenosis.  No high-grade canal stenosis. Electronically Signed   By: Mitzi Hansen M.D.   On: 09/19/2017 22:43   Mr Cervical Spine W Or Wo Contrast  Result Date: 09/19/2017 CLINICAL DATA:  67 y/o  F; 4 extremity weakness over the past month. EXAM: MRI HEAD WITHOUT CONTRAST MRI CERVICAL SPINE WITHOUT AND WITH CONTRAST TECHNIQUE: Multiplanar, multiecho pulse sequences of the cervical spine, to include the craniocervical junction and cervicothoracic junction, were obtained without and with intravenous contrast. Multiplanar, multiecho pulse sequences of the brain and surrounding structures were obtained without intravenous contrast. CONTRAST:  15mL MULTIHANCE GADOBENATE DIMEGLUMINE 529 MG/ML IV SOLN COMPARISON:  09/19/2017 CT head. FINDINGS: MRI HEAD FINDINGS Brain: No acute infarction, hemorrhage, hydrocephalus, extra-axial collection or mass lesion. Single punctate white matter hyperintensity within the left external  capsule of unlikely significance given age. No lesion is present within juxta cortical white matter, corpus callosum, brainstem, or cerebellum. No structural abnormality of the brain. Mild age-appropriate volume loss of the brain. Vascular: Normal flow voids. Skull and upper cervical spine: Normal marrow signal. Sinuses/Orbits: Negative. Other: None. MRI CERVICAL SPINE FINDINGS Alignment: Mild reversal of cervical curvature at C5. C4-5 minimal grade 1 anterolisthesis. Vertebrae: No fracture, evidence of discitis, or bone lesion. No abnormal enhancement. Cord: Normal signal and morphology.  No abnormal enhancement. Posterior Fossa, vertebral arteries, paraspinal tissues: Negative. Disc levels: C2-3: Right-sided facet fusion and facet hypertrophy with mild right foraminal stenosis. C3-4: Left-sided uncovertebral and facet hypertrophy with moderate left neural foraminal stenosis. Trace left facet effusion. C4-5: Disc osteophyte complex eccentric to the right with right greater than left uncovertebral and facet hypertrophy. Moderate right foraminal stenosis, ventral thecal sac effacement, and disc contact on the anterior cord. No significant canal stenosis. C5-6: Disc osteophyte complex with bilateral uncovertebral and facet hypertrophy. Severe bilateral foraminal stenosis and mild canal stenosis. Disc contact on the anterior cord with minimal anterior cord flattening. C6-7: Disc osteophyte complex with bilateral uncovertebral and facet hypertrophy. Mild bilateral foraminal stenosis. No significant canal stenosis. C7-T1: No significant disc displacement, foraminal stenosis, or canal stenosis. Mild bilateral facet hypertrophy. IMPRESSION: MRI head: No acute intracranial abnormality identified. Unremarkable MRI of the brain for age. MRI cervical spine. 1. No acute osseous or cord signal abnormality. 2. Mild reversal of cervical curvature and minimal C4-5 grade 1 anterolisthesis. 3. Cervical spondylosis predominantly at  the C4-5 and C5-6 levels. 4. Moderate left C3-4, moderate right C4-5, and severe bilateral C5-6 foraminal stenosis. 5. Mild C5-6 canal stenosis.  No high-grade canal stenosis. Electronically Signed   By: Mitzi Hansen M.D.   On: 09/19/2017 22:43    Procedures Procedures (including critical care time)  Medications Ordered in ED Medications  gadobenate dimeglumine (MULTIHANCE) injection 15 mL (15 mLs Intravenous Contrast Given 09/19/17 2223)     Initial Impression / Assessment and Plan / ED Course  I have reviewed the triage vital signs and the nursing notes.  Pertinent labs & imaging results that were available during my care of the patient were reviewed by me and considered in my medical decision making (see chart for details).     Patient is a 67 year old female who presents with a 3 to 4-week history of weakness of her upper extremities and difficulty grasping things as well as difficulty with her balance and frequent falls.  I do not appreciate any focal deficits on exam.  After discussion with Dr. Amada Jupiter with neurology, I did order an MRI of her brain as well  as an MRI of her cervical spine to rule out cord pathology.  These were negative for acute abnormality.  I spoke with Dr. Otelia Limes and it is felt that patient can be discharged with close outpatient neurology follow-up for further work-up.  Her labs are reviewed and are non-concerning.  I did an ambulatory referral to neurology.  Patient has been seen in the past by [facility] neurology.  It was requested that she have an appointment within the next week.  She was given return precautions.  Final Clinical Impressions(s) / ED Diagnoses   Final diagnoses:  Upper extremity weakness    ED Discharge Orders         Ordered    Ambulatory referral to Neurology    Comments:  An appointment is requested in approximately: 1 week   09/19/17 2330           Rolan Bucco, MD 09/19/17 2336

## 2017-09-28 DIAGNOSIS — Z96652 Presence of left artificial knee joint: Secondary | ICD-10-CM | POA: Diagnosis not present

## 2017-09-28 DIAGNOSIS — Z471 Aftercare following joint replacement surgery: Secondary | ICD-10-CM | POA: Diagnosis not present

## 2017-09-28 DIAGNOSIS — M25562 Pain in left knee: Secondary | ICD-10-CM | POA: Diagnosis not present

## 2017-09-28 DIAGNOSIS — R600 Localized edema: Secondary | ICD-10-CM | POA: Diagnosis not present

## 2017-10-03 DIAGNOSIS — R600 Localized edema: Secondary | ICD-10-CM | POA: Diagnosis not present

## 2017-10-03 DIAGNOSIS — Z96652 Presence of left artificial knee joint: Secondary | ICD-10-CM | POA: Diagnosis not present

## 2017-10-03 DIAGNOSIS — Z471 Aftercare following joint replacement surgery: Secondary | ICD-10-CM | POA: Diagnosis not present

## 2017-10-03 DIAGNOSIS — M25562 Pain in left knee: Secondary | ICD-10-CM | POA: Diagnosis not present

## 2017-10-04 ENCOUNTER — Encounter: Payer: Self-pay | Admitting: Neurology

## 2017-10-04 DIAGNOSIS — R531 Weakness: Secondary | ICD-10-CM | POA: Diagnosis not present

## 2017-10-04 DIAGNOSIS — R29898 Other symptoms and signs involving the musculoskeletal system: Secondary | ICD-10-CM | POA: Diagnosis not present

## 2017-10-04 DIAGNOSIS — M25562 Pain in left knee: Secondary | ICD-10-CM | POA: Diagnosis not present

## 2017-10-09 DIAGNOSIS — Z96652 Presence of left artificial knee joint: Secondary | ICD-10-CM | POA: Diagnosis not present

## 2017-10-09 DIAGNOSIS — Z471 Aftercare following joint replacement surgery: Secondary | ICD-10-CM | POA: Diagnosis not present

## 2017-10-09 DIAGNOSIS — R600 Localized edema: Secondary | ICD-10-CM | POA: Diagnosis not present

## 2017-10-09 DIAGNOSIS — M25562 Pain in left knee: Secondary | ICD-10-CM | POA: Diagnosis not present

## 2017-10-11 ENCOUNTER — Ambulatory Visit (INDEPENDENT_AMBULATORY_CARE_PROVIDER_SITE_OTHER): Payer: Medicare Other | Admitting: Neurology

## 2017-10-11 ENCOUNTER — Encounter: Payer: Self-pay | Admitting: Neurology

## 2017-10-11 VITALS — BP 125/75 | HR 74 | Ht 62.5 in | Wt 157.0 lb

## 2017-10-11 DIAGNOSIS — R292 Abnormal reflex: Secondary | ICD-10-CM

## 2017-10-11 DIAGNOSIS — M47812 Spondylosis without myelopathy or radiculopathy, cervical region: Secondary | ICD-10-CM

## 2017-10-11 DIAGNOSIS — R29898 Other symptoms and signs involving the musculoskeletal system: Secondary | ICD-10-CM

## 2017-10-11 DIAGNOSIS — R2689 Other abnormalities of gait and mobility: Secondary | ICD-10-CM

## 2017-10-11 DIAGNOSIS — M21371 Foot drop, right foot: Secondary | ICD-10-CM | POA: Diagnosis not present

## 2017-10-11 DIAGNOSIS — R799 Abnormal finding of blood chemistry, unspecified: Secondary | ICD-10-CM

## 2017-10-11 NOTE — Progress Notes (Signed)
Subjective:    Patient ID: Wendy Clark is a 67 y.o. female.  HPI     Huston Foley, MD, PhD [facility] [address] [address] [place], [place] [address]  Dear Toni Amend,   I saw your patient, Wendy Clark, upon your kind request in my neurologic clinic today for initial consultation of her upper extremity weakness. The patient is unaccompanied today. As you know, Wendy Clark is a 67 year old right-handed woman with an underlying medical history of allergic rhinitis, low back pain, prior smoking, vitamin D deficiency, restless leg syndrome, arthritis, hand tremors, and recent status post left knee replacement surgery on 07/17/2017, who reports weakness in both upper extremities for the past few months. I reviewed your office note from 09/08/2017. She had blood work in your office including CBC with differential, CMP, B12 level and TSH. I reviewed test results, which were benign with the exception of elevated vitamin B12 at 1171.  She presented to the emergency room on 09/19/2017 with bilateral upper extremity weakness. I reviewed the emergency room records. She had a head CT without contrast on 09/19/2017 and I reviewed the results: IMPRESSION: No acute intracranial pathology.  She had a brain MRI without contrast and cervical spine MRI without contrast on 09/19/2017 and I reviewed the results: IMPRESSION: MRI head:   No acute intracranial abnormality identified. Unremarkable MRI of the brain for age.   MRI cervical spine.   1. No acute osseous or cord signal abnormality. 2. Mild reversal of cervical curvature and minimal C4-5 grade 1 anterolisthesis. 3. Cervical spondylosis predominantly at the C4-5 and C5-6 levels. 4. Moderate left C3-4, moderate right C4-5, and severe bilateral C5-6 foraminal stenosis. 5. Mild C5-6 canal stenosis.  No high-grade canal stenosis.  I have seen her previously over 6 months ago for a long-standing history of hand  tremors. She had a benign appearing bilateral upper extremity tremor and we mutually agreed to follow-up as needed.  After she had knee replacement surgery she did well until late July 2019. She gradually started noticing grip strength weakness in both upper extremities, right more than left. She has had falls. She feels weaker in both legs, right more than left. She was found to have foot drop by her physical therapist who has been working with her for her left knee. She uses a single-point cane, she has a walker as well. She has no family history of myasthenia or ALS but does have a family history of either hereditary neuropathy or some other muscle disease she recalls. Her paternal aunt had to wear a foot brace and her aunt's 2 daughters have leg muscle weakness as well. She has not noticed any facial weakness but does feel like she is talking slower and more deliberate. People have noted her slower speech.  She is quite stressed and feels she has become more emotional lately.  Previously:  03/13/2017: (She) has had a bilateral upper extremity tremor for years. She feels that this has become worse in the past several months. I reviewed your office note from 02/07/2017, which you kindly included. She quit smoking. She drinks alcohol socially, 2-3 times per month on average. She has not noticed any impact on her tremor with regards to drinking alcohol. She drinks caffeine in the form of hot tea one cup per day and 3 cups of iced tea per day on average. After about 1 PM she drinks decaf tea. She does report a family history of tremor, one of her  brothers has a tremor. She has 3 brothers. She believes that her father may have had a hand tremor. She has been on Prozac low-dose for over 20 years, presumably for restless leg syndrome and leg movements at night. She reports that when she tried to reduce her Prozac dose or try to come off of it in the past her leg movements became worse. She tries to hydrate well.  She exercises regularly and has noticed a temporary flareup in her tremors after strenuous exercise such as weight lifting. She has a history of low back pain and had an MRI for this in 2017 which I reviewed. She has not seen a spine specialist. She did see orthopedics for her left knee in the past, probably 2 or 3 years ago.  Her Past Medical History Is Significant For: Past Medical History:  Diagnosis Date  . Allergic rhinitis   . Arthritis    oa  . Headache    occ stress HA   . Osteoporosis   . Tremors of nervous system   . Urinary urgency     Her Past Surgical History Is Significant For: Past Surgical History:  Procedure Laterality Date  . NO PAST SURGERIES    . TOTAL KNEE ARTHROPLASTY Left 07/17/2017   Procedure: LEFT TOTAL KNEE ARTHROPLASTY;  Surgeon: Gean Birchwood, MD;  Location: [facility];  Service: Orthopedics;  Laterality: Left;    Her Family History Is Significant For: No family history on file.  Her Social History Is Significant For: Social History   Socioeconomic History  . Marital status: Married    Spouse name: Not on file  . Number of children: Not on file  . Years of education: Not on file  . Highest education level: Not on file  Occupational History  . Not on file  Social Needs  . Financial resource strain: Not on file  . Food insecurity:    Worry: Not on file    Inability: Not on file  . Transportation needs:    Medical: Not on file    Non-medical: Not on file  Tobacco Use  . Smoking status: Former Smoker    Years: 15.00    Types: Cigarettes  . Smokeless tobacco: Never Used  Substance and Sexual Activity  . Alcohol use: Yes    Comment: 2 glasses red wine 1-2 times a month socially   . Drug use: Not Currently    Comment: unsure of quit date but states i havent smoked ina while   . Sexual activity: Not on file  Lifestyle  . Physical activity:    Days per week: Not on file    Minutes per session: Not on file  . Stress: Not on file  Relationships   . Social connections:    Talks on phone: Not on file    Gets together: Not on file    Attends religious service: Not on file    Active member of club or organization: Not on file    Attends meetings of clubs or organizations: Not on file    Relationship status: Not on file  Other Topics Concern  . Not on file  Social History Narrative  . Not on file    Her Allergies Are:  Allergies  Allergen Reactions  . Novocain [Procaine]     pt stated, "I get faint, pass out, light-headed"  :   Her Current Medications Are:  Outpatient Encounter Medications as of 10/11/2017  Medication Sig  . Calcium Carbonate-Vitamin D (CALCIUM-D PO)  Take 1 tablet by mouth 2 (two) times daily.  . cholecalciferol (VITAMIN D) 1000 units tablet Take 1,000 Units by mouth 2 (two) times daily.  . ferrous sulfate 325 (65 FE) MG tablet Take 325 mg by mouth daily with breakfast.  . FLUoxetine (PROZAC) 20 MG capsule Take 20 mg by mouth daily.  Marland Kitchen loratadine (CLARITIN) 10 MG tablet Take 10 mg by mouth daily as needed for allergies.   . Omega-3 Fatty Acids (FISH OIL) 1200 MG CAPS Take 1,200 mg by mouth every evening.   . [DISCONTINUED] aspirin EC 81 MG tablet Take 1 tablet (81 mg total) by mouth 2 (two) times daily. (Patient not taking: Reported on 09/19/2017)  . [DISCONTINUED] oxyCODONE-acetaminophen (PERCOCET/ROXICET) 5-325 MG tablet Take 1 tablet by mouth every 4 (four) hours as needed for severe pain. (Patient not taking: Reported on 09/19/2017)  . [DISCONTINUED] tiZANidine (ZANAFLEX) 2 MG tablet Take 1 tablet (2 mg total) by mouth every 6 (six) hours as needed. (Patient not taking: Reported on 09/19/2017)   No facility-administered encounter medications on file as of 10/11/2017.   :   Review of Systems:  Out of a complete 14 point review of systems, all are reviewed and negative with the exception of these symptoms as listed below:  I Review of Systems  Neurological:       Pt presents today to discuss her weakness. Pt  had aleft knee replacement in July. The left knee is doing well but pt feels weakness in her extremities that have resulted in falls.    Objective:  Neurological Exam  Physical Exam Physical Examination:   Vitals:   10/11/17 1135  BP: 125/75  Pulse: 74    General Examination: The patient is a very pleasant 67 y.o. female in no acute distress. She appears well-developed and well-nourished and well groomed. Tearful briefly.  HEENT: Normocephalic, atraumatic, pupils are equal, round and reactive to light and accommodation. She wears corrective eyeglasses. Neck is supple with full range of motion. She has no head or neck tremor today. She has no obvious dysarthria but speech is somewhat slower and deliberate sounding. Airway examination is benign. Extraocular tracking is good. Chest: Clear to auscultation without wheezing, rhonchi or crackles noted.  Heart: S1+S2+0, regular and normal without murmurs, rubs or gallops noted.   Abdomen: Soft, non-tender and non-distended with normal bowel sounds appreciated on auscultation.  Extremities: There is non-pitting swelling in both lower extremities, left more than right.  Skin: Warm and dry without trophic changes noted.  Musculoskeletal: exam reveals Unremarkable left knee scar from knee replacement surgery, mild swelling left knee, bilateral lower extremity swelling.  Neurologically:  Mental status: The patient is awake, alert and oriented in all 4 spheres. Her immediate and remote memory, attention, language skills and fund of knowledge are appropriate. There is no evidence of aphasia, agnosia, apraxia or anomia. Speech is clear, thought process is linear. Mood is normal and affect is constricted, tearful at times.   Cranial nerves II - XII are as described above under HEENT exam. In addition: shoulder shrug is normal with equal shoulder height noted. Motor exam: Normal bulk, strength is globally 4 out of 5, weaker distally and weaker  on the right compared to the left. She has no obvious fasciculations, no focal atrophy. She has grip strength weakness, 4 out of 5, intrinsic hand muscle weakness on the right. She has a right foot drop. She has no resting or postural tremor. Romberg is not tested due to safety concerns.  (  On 03/13/2017: On Archimedes spiral drawing she has no significant difficulty with her right, dominant hand, left spiral is showing mild tremulousness him a handwriting is not tremulous, legible, not micrographic.) Reflexes are 3+ throughout. Fine motor skills and coordination: impaired mildly in all 4 extremities. Cerebellar testing: No dysmetria or intention tremor on finger to nose testing.  Sensory exam: intact to light touch, pinprick, vibration, temperature sense in the upper and lower extremities.  Gait, station and balance: She stands With mild difficulty and has to push herself up. She stands wide-based. Tandem walk is not possible. She walks with a single-point cane and is able to walk some without her cane, walks wide-based and has a foot drop on the right. Assessment and Plan:   In summary, Wendy Clark is a very pleasant 67 year old female with an underlying medical history of allergic rhinitis, low back pain, prior smoking, vitamin D deficiency, restless leg syndrome, arthritis, hand tremors, and recent status post left knee replacement surgery on 07/17/2017, who presents for evaluation of her weakness. She has developed weakness in the past 2 or 3 months which has been progressive. She has distal more than proximal weakness and hyperreflexia which is new on examination. She also has fine motor difficulties, right foot drop and right-sided weakness is more than left. Recent brain MRI showed age-appropriate findings, cervical spine MRI showed degenerative changes at multiple levels. While she may have a component of degenerative spine disease at the neck level, her symptomatology is not fully explained by  cervical spinal spondylosis alone. I would like to evaluate things further with blood work to include myasthenia panel and CK level for now. We will also schedule an EMG and nerve conduction test for all 4 extremities in our office. We will keep her posted as to her test results and may have to do additional and more elaborate testing and involve neuromuscular expertise at some point. For now, I will see her back after her testing is done and we will call her with her test results in the interim. She is advised that I will send a request for a right AFO to her physical therapist at [facility] orth[facility]. Thank you very much for allowing me to participate in the care of this nice patient. If I can be of any further assistance to you please do not hesitate to call me at [phone].  Sincerely,   Huston Foley, MD, PhD

## 2017-10-11 NOTE — Patient Instructions (Addendum)
You have become weaker in the past 2+ months.  I would like to investigate things further with:  We will check blood work today and call you with the test results. We will do an EMG and nerve conduction velocity test, which is an electrical nerve and muscle test, which we will schedule. We will call you with the results. You are indeed weaker on the right side.

## 2017-10-13 ENCOUNTER — Telehealth: Payer: Self-pay | Admitting: Neurology

## 2017-10-13 DIAGNOSIS — Z471 Aftercare following joint replacement surgery: Secondary | ICD-10-CM | POA: Diagnosis not present

## 2017-10-13 DIAGNOSIS — M25562 Pain in left knee: Secondary | ICD-10-CM | POA: Diagnosis not present

## 2017-10-13 DIAGNOSIS — R600 Localized edema: Secondary | ICD-10-CM | POA: Diagnosis not present

## 2017-10-13 DIAGNOSIS — Z96652 Presence of left artificial knee joint: Secondary | ICD-10-CM | POA: Diagnosis not present

## 2017-10-13 NOTE — Telephone Encounter (Signed)
Pt is wanting PT referral sent to [facility]. She is already going there and it is closer to her home. Please call to advise on Monday

## 2017-10-14 ENCOUNTER — Encounter: Payer: Self-pay | Admitting: Neurology

## 2017-10-17 NOTE — Telephone Encounter (Signed)
Referral has been faxed to [facility] . [phone] . Patient aware. [phone] .

## 2017-10-18 ENCOUNTER — Telehealth: Payer: Self-pay

## 2017-10-18 ENCOUNTER — Telehealth: Payer: Self-pay | Admitting: Neurology

## 2017-10-18 DIAGNOSIS — M21371 Foot drop, right foot: Secondary | ICD-10-CM

## 2017-10-18 DIAGNOSIS — Z96652 Presence of left artificial knee joint: Secondary | ICD-10-CM | POA: Diagnosis not present

## 2017-10-18 DIAGNOSIS — R600 Localized edema: Secondary | ICD-10-CM | POA: Diagnosis not present

## 2017-10-18 DIAGNOSIS — M25562 Pain in left knee: Secondary | ICD-10-CM | POA: Diagnosis not present

## 2017-10-18 DIAGNOSIS — Z471 Aftercare following joint replacement surgery: Secondary | ICD-10-CM | POA: Diagnosis not present

## 2017-10-18 NOTE — Addendum Note (Signed)
Addended by: Geronimo Running A on: 10/18/2017 04:07 PM   Modules accepted: Orders

## 2017-10-18 NOTE — Telephone Encounter (Signed)
Order faxed to [facility]. Received a receipt of confirmation.

## 2017-10-18 NOTE — Addendum Note (Signed)
Addended by: Geronimo Running A on: 10/18/2017 04:08 PM   Modules accepted: Orders

## 2017-10-18 NOTE — Telephone Encounter (Signed)
VO received for R AFO for R foot drop order placed.

## 2017-10-18 NOTE — Telephone Encounter (Signed)
Please call her back: PCP may want to refer her to Neurology at another location, I can talk to Dr. Anne Hahn and Terrace Arabia too, but we are so short-staffed right now. I would say, if she has had significant increase in weakness, she may have to go to the ER. I am also at a loss.

## 2017-10-18 NOTE — Telephone Encounter (Signed)
Dr. Terrace Arabia and Gifford Shave are agreeable to working pt in on Friday morning. I advised pt that if she has significant increase in weakness that she should go to the ER. Pt is also agreeable to the additional blood work that Dr. Frances Furbish has given me VOs for. Pt verbalized understanding and great appreciation. Pt verbalized understanding of appt date and time.

## 2017-10-18 NOTE — Telephone Encounter (Signed)
I called pt and updated her on her lab work. Pt is tearful. Pt reports that she is weaker on her "good side" now. She is asking if she can get the EMG done any sooner. I advised her that we are checking multiple times daily for a cancellation for EMG studies and there are none available currently. Pt is asking if she can go to the hospital to get this test done. Pt says that Dr. Frances Furbish is the only one that can give her a diagnosis because no one else will and she wants to know ASAP because she keeps getting weaker.  I advised her that I will speak to Dr. Frances Furbish about this ASAP. Pt verbalized understanding.

## 2017-10-18 NOTE — Progress Notes (Signed)
Please call patient to update her that the Myasthenia test was negative, and muscle enzymes were increased, which I had emailed her about already.  Wendy Clark

## 2017-10-18 NOTE — Telephone Encounter (Signed)
Patient needs a AFO Brace RX sent to [facility]  [address] [place] [address] .  [phone].  Telephone  - fax - [phone] .

## 2017-10-18 NOTE — Telephone Encounter (Signed)
-----   Message from Huston Foley, MD sent at 10/18/2017  1:41 PM EDT ----- Please call patient to update her that the Myasthenia test was negative, and muscle enzymes were increased, which I had emailed her about already.  Janene Harvey

## 2017-10-19 LAB — MYASTHENIA GRAVIS FULL PANEL
ACETYLCHOL BLOCK AB: 19 % (ref 0–25)
Anti-striation Abs: NEGATIVE

## 2017-10-19 LAB — CK: Total CK: 348 U/L — ABNORMAL HIGH (ref 24–173)

## 2017-10-20 ENCOUNTER — Ambulatory Visit (INDEPENDENT_AMBULATORY_CARE_PROVIDER_SITE_OTHER): Payer: Medicare Other | Admitting: Neurology

## 2017-10-20 ENCOUNTER — Other Ambulatory Visit (INDEPENDENT_AMBULATORY_CARE_PROVIDER_SITE_OTHER): Payer: Self-pay

## 2017-10-20 ENCOUNTER — Telehealth: Payer: Self-pay | Admitting: Neurology

## 2017-10-20 ENCOUNTER — Other Ambulatory Visit: Payer: Self-pay | Admitting: *Deleted

## 2017-10-20 DIAGNOSIS — R7309 Other abnormal glucose: Secondary | ICD-10-CM | POA: Diagnosis not present

## 2017-10-20 DIAGNOSIS — M21371 Foot drop, right foot: Secondary | ICD-10-CM

## 2017-10-20 DIAGNOSIS — R292 Abnormal reflex: Secondary | ICD-10-CM

## 2017-10-20 DIAGNOSIS — R29898 Other symptoms and signs involving the musculoskeletal system: Secondary | ICD-10-CM

## 2017-10-20 DIAGNOSIS — R269 Unspecified abnormalities of gait and mobility: Secondary | ICD-10-CM | POA: Diagnosis not present

## 2017-10-20 DIAGNOSIS — M47812 Spondylosis without myelopathy or radiculopathy, cervical region: Secondary | ICD-10-CM

## 2017-10-20 DIAGNOSIS — R790 Abnormal level of blood mineral: Secondary | ICD-10-CM | POA: Diagnosis not present

## 2017-10-20 DIAGNOSIS — M6281 Muscle weakness (generalized): Secondary | ICD-10-CM

## 2017-10-20 DIAGNOSIS — Z79899 Other long term (current) drug therapy: Secondary | ICD-10-CM | POA: Diagnosis not present

## 2017-10-20 DIAGNOSIS — E559 Vitamin D deficiency, unspecified: Secondary | ICD-10-CM | POA: Diagnosis not present

## 2017-10-20 DIAGNOSIS — R2689 Other abnormalities of gait and mobility: Secondary | ICD-10-CM

## 2017-10-20 DIAGNOSIS — R799 Abnormal finding of blood chemistry, unspecified: Secondary | ICD-10-CM

## 2017-10-20 DIAGNOSIS — Z0289 Encounter for other administrative examinations: Secondary | ICD-10-CM

## 2017-10-20 NOTE — Procedures (Signed)
Full Name: Wendy Clark Gender: Female MRN #: [id number] Date of Birth: 2050-09-29    Visit Date: 10/20/17 07:47 Age: 67 Years 10 Months Old Examining Physician:  Levert Feinstein, MD  Referring Physician: Frances Furbish, MD History: 67 year old female, complains of rapid onset of muscle weakness since August 2019 involving both upper and lower extremity, there was no significant sensory loss.  Summary of the tests: Nerve conduction study: Bilateral ulnar, median, sural, radial sensory responses were normal.  Right superficial peroneal sensory responses showed mildly decreased to snap amplitude.  Left superficial peroneal sensory responses were absent likely due to her left knee replacement.  Left tibial motor responses were normal.  Right tibial motor response showed moderately decreased the C map amplitude, with normal distal latency, conduction velocity.  Left peroneal to EDB motor response was absent.  Right peroneal to EDB motor response showed significantly decreased the C map amplitude, with normal distal latency, mildly decreased conduction velocity.  Bilateral tibial F-wave latencies were normal.  Bilateral ulnar and median motor response showed moderate to severely decreased C map amplitude, with normal distal latency, conduction velocity, bilateral ulnar F-wave latency were normal.  Electromyography: Selective needle examination were performed at bilateral lower extremity muscles, left upper extremity muscle, left lumbar, thoracic, cervical paraspinal muscles, left orbicularis oculi.  There is evidence of bilateral lower extremity muscles active denervation, large complex motor unit potential with decreased recruitment patterns, consistent with active neuropathic changes involving bilateral lumbosacral myotomes.  Selective needle examination of left upper extremity muscles also demonstrate increased insertional activity, remodeling of motor unit potential, complex enlarged motor unit  potential with decreased recruitment patterns, consistent with active neuropathic changes involving left cervical myotomes.  I was not able to appreciate significant active denervation at the left lumbar, cervical, thoracic paraspinal muscles.  Needle examination of left orbicularis oculi showed increased insertional activity, mild complex motor unit potential with mildly decreased recruitment patterns.  Conclusion: This is an abnormal study.  There is electrodiagnostic evidence of active neuropathic changes involving bilateral lumbosacral myotomes, left cervical myotomes, with no definite evidence of neuropathic changes involving left thoracic, or bulbar muscles.  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  Extensive imaging study and laboratory evaluations were planned.    ------------------------------- Levert Feinstein M.D. PhD  [facility] [address] [place], [place] [address] Tel: [phone] Fax: [phone]        [facility]    Nerve / Sites Muscle Latency Ref. Amplitude Ref. Rel Amp Segments Distance Velocity Ref. Area    ms ms mV mV %  cm m/s m/s mVms  R Median - APB     Wrist APB 3.7 ?4.4 1.6 ?4.0 100 Wrist - APB 7   4.0     Upper arm APB 7.6  1.2  76 Upper arm - Wrist 20 51 ?49 3.4  L Median - APB     Wrist APB 3.3 ?4.4 1.9 ?4.0 100 Wrist - APB 7   6.7     Upper arm APB 7.0  1.5  81.3 Upper arm - Wrist 20 54 ?49 5.8  R Ulnar - ADM     Wrist ADM 3.2 ?3.3 3.0 ?6.0 100 Wrist - ADM 7   8.8     B.Elbow ADM 6.9  2.6  87.3 B.Elbow - Wrist 18 49 ?49 8.5     A.Elbow ADM 9.4  2.4  91.5 A.Elbow - B.Elbow 12  49 ?49 8.0         A.Elbow - Wrist      L Ulnar - ADM     Wrist ADM 2.9 ?3.3 4.2 ?6.0 100 Wrist - ADM 7   15.8     B.Elbow ADM 6.4  3.8  90.6 B.Elbow - Wrist 18 52 ?49 14.8     A.Elbow ADM 8.3  3.6  93.6 A.Elbow - B.Elbow 10 51 ?49 14.1         A.Elbow -  Wrist      R Peroneal - EDB     Ankle EDB 5.8 ?6.5 0.6 ?2.0 100 Ankle - EDB 9   1.5     Fib head EDB 12.8  0.6  87.3 Fib head - Ankle 28 40 ?44 1.2     Pop fossa EDB 15.3  0.6  105 Pop fossa - Fib head 10 40 ?44 1.3         Pop fossa - Ankle      L Peroneal - EDB     Ankle EDB NR ?6.5 NR ?2.0 NR Ankle - EDB 9   NR     Fib head EDB      Fib head - Ankle 28 NR ?44   R Tibial - AH     Ankle AH 4.7 ?5.8 2.5 ?4.0 100 Ankle - AH 9   7.5     Pop fossa AH 13.4  2.3  93.7 Pop fossa - Ankle 36 41 ?41 4.9  L Tibial - AH     Ankle AH 5.7 ?5.8 5.1 ?4.0 100 Ankle - AH 9   13.2     Pop fossa AH 14.4  2.8  56.4 Pop fossa - Ankle 36 42 ?41 8.8                      SNC    Nerve / Sites Rec. Site Peak Lat Ref.  Amp Ref. Segments Distance    ms ms V V  cm  R Radial - Anatomical snuff box (Forearm)     Forearm Wrist 2.5 ?2.9 17 ?15 Forearm - Wrist 10  L Radial - Anatomical snuff box (Forearm)     Forearm Wrist 2.7 ?2.9 12 ?15 Forearm - Wrist 10  R Sural - Ankle (Calf)     Calf Ankle 4.3 ?4.4 8 ?6 Calf - Ankle 14  L Sural - Ankle (Calf)     Calf Ankle 4.3 ?4.4 6 ?6 Calf - Ankle 14  R Superficial peroneal - Ankle     Lat leg Ankle 4.4 ?4.4 4 ?6 Lat leg - Ankle 14  L Superficial peroneal - Ankle     Lat leg Ankle NR ?4.4 NR ?6 Lat leg - Ankle 14  R Median - Orthodromic (Dig II, Mid palm)     Dig II Wrist 3.0 ?3.4 22 ?10 Dig II - Wrist 13  L Median - Orthodromic (Dig II, Mid palm)     Dig II Wrist 3.0 ?3.4 20 ?10 Dig II - Wrist 13  R Ulnar - Orthodromic, (Dig V, Mid palm)     Dig V Wrist 2.6 ?3.1 14 ?5 Dig V - Wrist 11  L Ulnar - Orthodromic, (Dig V, Mid palm)     Dig V Wrist 2.9 ?3.1 13 ?5 Dig V - Wrist 11  F  Wave    Nerve F Lat Ref.   ms ms  R Tibial - AH 49.5 ?56.0  R Ulnar - ADM 30.5 ?32.0  L Ulnar - ADM 29.0 ?32.0  L Tibial - AH 47.8 ?56.0             EMG full       EMG Summary Table    Spontaneous MUAP Recruitment  Muscle IA Fib PSW Fasc Other Amp Dur.  Poly Pattern  L.  Orbicularis oculi  increased None None None ______  normal  normal  normal  reduced  R. Tibialis anterior Increased 2+ None None _______ Increased Increased Normal Reduced  R. Gastrocnemius (Medial head) Increased None None None _______ Increased Increased Normal Reduced  R. Vastus lateralis Increased None None None _______ Increased Normal Normal Reduced  L. Tibialis anterior Increased 2+ None None _______ Increased Increased 1+ Reduced  L. Gastrocnemius (Medial head) Increased None None None _______ Increased Increased Normal Reduced  L. First dorsal interosseous Increased None None None _______ Increased Increased Normal Reduced  L. Pronator teres Increased None None None _______ Normal Normal Normal Reduced  L. Deltoid Increased None None None _______ Normal Normal Normal Reduced  L. Biceps brachii Increased None None None _______ Normal Normal Normal Reduced  L. Cervical paraspinals Increased None None None _______ Normal Normal Normal Normal  L. Thoracic paraspinals (low) Increased None None None _______ Normal Normal Normal Normal  L. Lumbar paraspinals (mid) Increased None None None _______ Normal Normal Normal Normal  Left thoracic paraspinal muscles (mid)  normal  None  None None _______ Normal Normal Normal Normal  L. Lumbar paraspinals (low) Normal None None None _______ Normal Normal Normal Normal

## 2017-10-20 NOTE — Telephone Encounter (Signed)
Patient has an apt with Dr. Alphonzo Dublin Oct . 24 th arrive at 2:30 Telephone [phone] - fax [phone] .  Called Patient and left her a message.

## 2017-10-20 NOTE — Progress Notes (Signed)
PATIENT: Wendy Clark DOB: August 23, 1950  No chief complaint on file.    HISTORICAL  Wendy Clark is a 67 year old female, seen in request by my colleague Dr. Frances Furbish for evaluation of subacute onset muscle weakness, initial evaluation was on October 20, 2017.  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  She was initially evaluated by Dr. Frances Furbish in February 2019 for intermittent bilateral hands posturing tremor, there was no significant muscle weakness noted by patient no examination at that time.  She had left knee replacement on July 17, 2017, post surgically, she recovered very well, but in early August 2019, she noticed weakness of bilateral hand muscle, difficulty opening a package, also noticed bilateral arm weakness when putting dishes away in the cabinet, later herself, and also noted by physical therapy, that she has bilateral lower extremity weakness, right foot drop, progressive rapidly since then, with noticeable change with each physical therapy session, she also noticed mild slurred speech, she denies significant sensory loss, no double vision,  She presented to the emergency room on September 3 for above symptoms, I personally reviewed films, the head, brain MRI showed no significant abnormality, MRI cervical spine showed multilevel degenerative changes, but there was no evidence of significant canal stenosis, variable degree of foraminal narrowing, at C 5-6,  Laboratory evaluations: Mild elevated CPK 348, normal and negative troponin, CMP, CBC, myasthenia gravis panel,  She denies muscle fasciculations,  REVIEW OF SYSTEMS: Full 14 system review of systems performed and notable only for as above All other review of systems were negative.  ALLERGIES: Allergies  Allergen Reactions  . Novocain [Procaine]      pt stated, "I get faint, pass out, light-headed"    HOME MEDICATIONS: Current Outpatient Medications  Medication Sig Dispense Refill  . Calcium Carbonate-Vitamin D (CALCIUM-D PO) Take 1 tablet by mouth 2 (two) times daily.    . cholecalciferol (VITAMIN D) 1000 units tablet Take 1,000 Units by mouth 2 (two) times daily.    . ferrous sulfate 325 (65 FE) MG tablet Take 325 mg by mouth daily with breakfast.    . FLUoxetine (PROZAC) 20 MG capsule Take 20 mg by mouth daily.    Marland Kitchen loratadine (CLARITIN) 10 MG tablet Take 10 mg by mouth daily as needed for allergies.     . Omega-3 Fatty Acids (FISH OIL) 1200 MG CAPS Take 1,200 mg by mouth every evening.      No current facility-administered medications for this visit.     PAST MEDICAL HISTORY: Past Medical History:  Diagnosis Date  . Allergic rhinitis   . Arthritis    oa  . Headache    occ stress HA   . Osteoporosis   . Tremors of nervous system   . Urinary urgency     PAST SURGICAL HISTORY: Past Surgical History:  Procedure Laterality Date  . NO PAST SURGERIES    . 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  SOCIAL HISTORY: Social History   Socioeconomic History  . Marital status: Married    Spouse name: Not on file  . Number of children: Not on file  . Years of education: Not on file  .  Highest education level: Not on file  Occupational History  . Not on file  Social Needs  . Financial resource strain: Not on file  . Food insecurity:    Worry: Not on file    Inability: Not on file  . Transportation needs:    Medical: Not on file    Non-medical: Not on file  Tobacco Use  . Smoking status: Former Smoker    Years: 15.00    Types: Cigarettes  . Smokeless tobacco: Never Used  Substance and Sexual Activity  . Alcohol use: Yes    Comment: 2  glasses red wine 1-2 times a month socially   . Drug use: Not Currently    Comment: unsure of quit date but states i havent smoked ina while   . Sexual activity: Not on file  Lifestyle  . Physical activity:    Days per week: Not on file    Minutes per session: Not on file  . Stress: Not on file  Relationships  . Social connections:    Talks on phone: Not on file    Gets together: Not on file    Attends religious service: Not on file    Active member of club or organization: Not on file    Attends meetings of clubs or organizations: Not on file    Relationship status: Not on file  . Intimate partner violence:    Fear of current or ex partner: Not on file    Emotionally abused: Not on file    Physically abused: Not on file    Forced sexual activity: Not on file  Other Topics Concern  . Not on file  Social History Narrative  . Not on file     PHYSICAL EXAM   There were no vitals filed for this visit.  Not recorded      There is no height or weight on file to calculate BMI.  PHYSICAL EXAMNIATION:  Gen: NAD, conversant, well nourised, obese, well groomed                     Cardiovascular: Regular rate rhythm, no peripheral edema, warm, nontender. Eyes: Conjunctivae clear without exudates or hemorrhage Neck: Supple, no carotid bruits. Pulmonary: Clear to auscultation bilaterally   NEUROLOGICAL EXAM:  MENTAL STATUS: Speech:    Speech is normal; fluent and spontaneous with normal comprehension.  Cognition:     Orientation to time, place and person     Normal recent and remote memory     Normal Attention span and concentration     Normal Language, naming, repeating,spontaneous speech     Fund of knowledge   CRANIAL NERVES: CN II: Visual fields are full to confrontation.Pupils are round equal and briskly reactive to light. CN III, IV, VI: extraocular movement are normal. No ptosis. CN V: Facial sensation is intact to pinprick in all 3 divisions bilaterally. Corneal  responses are intact.  CN VII: Mild to moderate bilateral eye closure, cheek puff weakness, CN VIII: Hearing is normal to rubbing fingers CN IX, X: Palate elevates symmetrically. Phonation is normal. CN XI: Head turning and shoulder shrug are intact CN XII: I was not able to appreciate tongue atrophy or fasciculation, but there is mild spastic slow tongue movement, with mild tongue protrusion into cheek muscle weakness,  MOTOR: I did not see significant muscle fasciculations, mild bilateral hand intrinsic muscle atrophy, she has mild to neck flexion, bilateral shoulder abduction weakness, mild to moderate bilateral elbow flexion, extension, wrist  flexion/extension, muscle weakness, mild to moderate bilateral hip flexion weakness, there was no significant bilateral knee flexion or extension weakness, significant bilateral foot drop, ankle dorsiflexion (R/L) 2/3+, ankle plantarflexion 3/4  REFLEXES: Reflexes are 3 and symmetric at the biceps, triceps, knees, and ankles. Plantar responses are extensor bilaterally, bilateral Hoffmann sign,  SENSORY: Intact to light touch, pinprick, positional sensation and vibratory sensation are intact in fingers and toes.  COORDINATION: Rapid alternating movements and fine finger movements are intact. There is no dysmetria on finger-to-nose and heel-knee-shin.    GAIT/STANCE: She needs to push on her walker to get up from seated position, bilateral foot drop, worsening on the right side, unsteady gait,  DIAGNOSTIC DATA (LABS, IMAGING, TESTING) - I reviewed patient records, labs, notes, testing and imaging myself where available.   ASSESSMENT AND PLAN  Wendy Clark is a 67 y.o. female   Painless subacute onset progressive worsening muscle atrophy, weakness, involving bulbar, bilateral cervical, lumbosacral myotomes.  There was no significant sensory loss  Hyperreflexia on examinations,  Potential localized to motor neuron, need to rule out nutritional  deficiency, infectious, inflammatory, paraneoplastic, structural lesion  Ordered MRI of thoracic, lumbar spine,  CT of chest, abdomen, pelvic,  Extensive laboratory evaluations, including inflammatory markers, paraneoplastic panels,  Urgent referral to [facility] Dr. Janan Ridge     Levert Feinstein, M.D. Ph.D.  [facility] [address], [address] [place], [place] [address] Ph: [phone] Fax: [phone]  CC: Dr. Frances Furbish

## 2017-10-23 ENCOUNTER — Telehealth: Payer: Self-pay | Admitting: Neurology

## 2017-10-23 DIAGNOSIS — R600 Localized edema: Secondary | ICD-10-CM | POA: Diagnosis not present

## 2017-10-23 DIAGNOSIS — M25562 Pain in left knee: Secondary | ICD-10-CM | POA: Diagnosis not present

## 2017-10-23 DIAGNOSIS — Z96652 Presence of left artificial knee joint: Secondary | ICD-10-CM | POA: Diagnosis not present

## 2017-10-23 DIAGNOSIS — Z471 Aftercare following joint replacement surgery: Secondary | ICD-10-CM | POA: Diagnosis not present

## 2017-10-23 NOTE — Telephone Encounter (Signed)
Medicare/aarp order sent to [facility] pt is aware of this order going to [facility]. I did give her their number of [phone] if she doesn't hear from them in 2-3 days.

## 2017-10-24 NOTE — Telephone Encounter (Signed)
Noted, thank you

## 2017-10-24 NOTE — Telephone Encounter (Signed)
Rule out pareneoplastic syndrome for her rapid progression of muscle weakness.

## 2017-10-24 NOTE — Telephone Encounter (Signed)
Patient MRI's are scheduled for 10/29/17.Marland Kitchen  Dola Factor with [employer] me, "this patient also has CT Chest and Abdomen scheduled for same diagnosis."  Could you please clarify the diagnoses for the CT's. Please advise and I will email her back.

## 2017-10-25 ENCOUNTER — Telehealth: Payer: Self-pay | Admitting: Neurology

## 2017-10-25 ENCOUNTER — Ambulatory Visit: Payer: Medicare Other | Admitting: Neurology

## 2017-10-25 ENCOUNTER — Encounter

## 2017-10-25 DIAGNOSIS — R29898 Other symptoms and signs involving the musculoskeletal system: Secondary | ICD-10-CM

## 2017-10-25 LAB — MUSK ANTIBODIES

## 2017-10-25 NOTE — Telephone Encounter (Signed)
Message       ----- Message -----  From: [name]  Sent: 10/24/2017  2:27 PM EDT  To: [facility]  Subject: Visit Follow-Up Question               Dr. Terrace Arabia,    Thank you for your testing of my muscles and your concern and recommendations. A few questions:  1. When will you be able to share the results of my various blood tests and what we've learned from them?  2. I've been trying to schedule my CT scans with [facility], but they tell me they are waiting for more specific instructions from you before they can schedule these. I'm hoping you two have been in communication to resolve this.  3. Is there any possibility that I could start PT to keep as much strength as possible in my upper body? I have 2 more PT sessions left from my knee surgery with the Orthopedist office that I will finish next week. We have been focusing on lower body strength. I would like some PT guidance on how to strengthen my upper body.  Thank you, Paislee Szatkowski    Please call patient, extensive laboratory evaluation showed mild elevated A1c 5.7, indicating mild elevated glucose over the past few months, but would not explain her current complaints, rest of the laboratory evaluation showed no significant abnormality.  Please find out the schedule of her CT chest/abdomen/pelvic,  I can put a referral for physical therapy if she wants.

## 2017-10-25 NOTE — Telephone Encounter (Signed)
Just CT wo contrast as screening imaging study to rule of paraneoplastic process for now.

## 2017-10-25 NOTE — Telephone Encounter (Signed)
Colin Mulders and Carney Bern the CT Tech and GI just wanted to make sure the CT's weren't going to be CT with contrast .. I informed them it is the CT's without and Colin Mulders is going to contact the patient to schedule.

## 2017-10-25 NOTE — Telephone Encounter (Signed)
Wendy Clark the CT Tech at [facility] imaging asked if if there is a reason you want the CT's to be wo contrast because she said it would be better CT's with contrast? If not they can still do it without contrast. Please advise and will let her know.

## 2017-10-25 NOTE — Telephone Encounter (Signed)
Noted, I informed Brianna at GI and she is going to inform Carney Bern the CT tech and they are going to schedule the CT wo contrast.

## 2017-10-26 ENCOUNTER — Telehealth: Payer: Self-pay

## 2017-10-26 LAB — RHEUMATOID FACTOR: Rhuematoid fact SerPl-aCnc: <10 IU/mL (ref 0.0–13.9)

## 2017-10-26 LAB — SEDIMENTATION RATE: Sed Rate: 22 mm/hr (ref 0–40)

## 2017-10-26 LAB — ANTINUCLEAR ANTIBODIES, IFA: ANA Titer 1: NEGATIVE

## 2017-10-26 LAB — C-REACTIVE PROTEIN: CRP: 4 mg/L (ref 0–10)

## 2017-10-26 LAB — B. BURGDORFI ANTIBODIES: Lyme IgG/IgM Ab: <0.91 {ISR} (ref 0.00–0.90)

## 2017-10-26 LAB — VITAMIN B1: THIAMINE: 119 nmol/L (ref 66.5–200.0)

## 2017-10-26 LAB — RPR: RPR Ser Ql: NONREACTIVE

## 2017-10-26 NOTE — Progress Notes (Signed)
Labs are good. Please update pt and remind her about her upcoming appt with Dr. Alphonzo Dublin later this month and FU with Dr. Terrace Arabia early Nov.  sa

## 2017-10-26 NOTE — Telephone Encounter (Signed)
I called pt and advised her that the labs Dr. Frances Furbish checked are good. I reminded pt of her appt with Dr. Alphonzo Dublin and her follow up with Dr. Terrace Arabia. Pt verbalized understanding.

## 2017-10-26 NOTE — Telephone Encounter (Signed)
-----   Message from Huston Foley, MD sent at 10/26/2017  8:20 AM EDT ----- Labs are good. Please update pt and remind her about her upcoming appt with Dr. Alphonzo Dublin later this month and FU with Dr. Terrace Arabia early Nov.  sa

## 2017-10-26 NOTE — Telephone Encounter (Signed)
I called pt today regarding lab work that Dr. Frances Furbish ordered. Pt had questions about the lab work Dr. Terrace Arabia ordered and I discussed those results with her. Pt is asking for a referral to neuro rehab for PT. Pt is also asking if there is any additional blood work that Dr. Terrace Arabia thinks is needed, such as lyme disease? She works extensively in her garden.  Order for neuro rehab placed.

## 2017-10-26 NOTE — Addendum Note (Signed)
Addended by: Geronimo Running A on: 10/26/2017 08:35 AM   Modules accepted: Orders

## 2017-10-27 DIAGNOSIS — R600 Localized edema: Secondary | ICD-10-CM | POA: Diagnosis not present

## 2017-10-27 DIAGNOSIS — Z96652 Presence of left artificial knee joint: Secondary | ICD-10-CM | POA: Diagnosis not present

## 2017-10-27 DIAGNOSIS — M25562 Pain in left knee: Secondary | ICD-10-CM | POA: Diagnosis not present

## 2017-10-27 DIAGNOSIS — Z471 Aftercare following joint replacement surgery: Secondary | ICD-10-CM | POA: Diagnosis not present

## 2017-10-27 NOTE — Telephone Encounter (Signed)
Pt asked me to send her a mychart message regarding her lab work when I spoke to her yesterday.

## 2017-10-27 NOTE — Telephone Encounter (Signed)
Please let patient know, Lyme titer was ordered it came back negative

## 2017-10-29 ENCOUNTER — Ambulatory Visit
Admission: RE | Admit: 2017-10-29 | Discharge: 2017-10-29 | Disposition: A | Discharge status: Home / Self Care | Payer: Medicare Other | Source: Ambulatory Visit | Attending: Neurology | Admitting: Neurology

## 2017-10-29 DIAGNOSIS — M6281 Muscle weakness (generalized): Secondary | ICD-10-CM

## 2017-10-30 ENCOUNTER — Telehealth: Payer: Self-pay | Admitting: Neurology

## 2017-10-30 DIAGNOSIS — M6281 Muscle weakness (generalized): Secondary | ICD-10-CM

## 2017-10-30 NOTE — Telephone Encounter (Signed)
See MyChart message from 10/30/17. MRI results sent via MyChart. I advised patient to let us know if she has any questions or concerns.

## 2017-10-30 NOTE — Telephone Encounter (Signed)
I have called patient, patient is crying over the phone, rapid progressing with weakness, difficulty using her hands, also developed urinary urgency, incontinence, wearing diaper now, mild bulbar weakness, frequent cough,  1.  I have suggested patient consider going to the emergency room if her symptoms worse 2.  She has appointment Janan Ridge at [facility] pending October 25th 2019. 3.  Potential localization to cervical cord, versus brainstem, repeat MRI of the brain, cervical spine with without contrast  Fluoroscopy guided lumbar puncture as soon as possible, including  Lyme titer, west nile virus PCR,   CT chest abdomen and pelvic is pending for Oct 31 2017/

## 2017-10-30 NOTE — Telephone Encounter (Signed)
Please call patient, MRI of the lumbar showed multilevel degenerative changes, most severe at L4-5, with mild spinal stenosis, moderately severe bilateral foraminal narrowing,  MRI of thoracic spine showed no significant abnormality.  Mild abnormality on MRI of lumbar spine would not explain the progression of her muscle weakness.   IMPRESSION: This MRI of the lumbar spine shows multilevel degenerative changes as detailed above.  The most significant findings are: 1.    At L4-L5, there is mild spinal stenosis due to disc protrusion, facet hypertrophy and ligamenta flava hypertrophy.  These degenerative changes causing moderately severe right foraminal narrowing and moderately severe left lateral recess stenosis with potential for right L4 and left L5 nerve root compression.  Degenerative changes have mildly progressed when compared to the 05/02/2015 MRI. 2.    At the other lumbar levels, there are degenerative changes that do not lead to nerve root compression or spinal stenosis  IMPRESSION: This MRI of the thoracic spine without contrast shows the following: 1.    The spinal cord appears normal. 2.    Disc bulges or protrusions from T5-T6 through T12-L1.  There is no foraminal narrowing, spinal stenosis or nerve root compression.

## 2017-10-31 ENCOUNTER — Ambulatory Visit
Admission: RE | Admit: 2017-10-31 | Discharge: 2017-10-31 | Disposition: A | Discharge status: Home / Self Care | Payer: Medicare Other | Source: Ambulatory Visit | Attending: Neurology | Admitting: Neurology

## 2017-10-31 ENCOUNTER — Other Ambulatory Visit: Payer: Self-pay

## 2017-10-31 ENCOUNTER — Telehealth: Payer: Self-pay | Admitting: Neurology

## 2017-10-31 VITALS — BP 122/65 | HR 72

## 2017-10-31 DIAGNOSIS — R29898 Other symptoms and signs involving the musculoskeletal system: Secondary | ICD-10-CM

## 2017-10-31 DIAGNOSIS — M21371 Foot drop, right foot: Secondary | ICD-10-CM

## 2017-10-31 DIAGNOSIS — R269 Unspecified abnormalities of gait and mobility: Secondary | ICD-10-CM

## 2017-10-31 DIAGNOSIS — M6281 Muscle weakness (generalized): Secondary | ICD-10-CM

## 2017-10-31 DIAGNOSIS — K753 Granulomatous hepatitis, not elsewhere classified: Secondary | ICD-10-CM | POA: Diagnosis not present

## 2017-10-31 DIAGNOSIS — J984 Other disorders of lung: Secondary | ICD-10-CM | POA: Diagnosis not present

## 2017-10-31 NOTE — Telephone Encounter (Signed)
Noted, thank you

## 2017-10-31 NOTE — Discharge Instructions (Signed)

## 2017-10-31 NOTE — Telephone Encounter (Signed)
VO received from Dr. Terrace Arabia for [facility].  Orders placed as "pre procedure" and are held for releasing tomorrow prior to MRIs.  I also spoke with Dr. Terrace Arabia. She wants the LP done at GI as soon as possible. I called [facility], spoke to [place]. Jenel Lucks will try to get pt worked in as soon as possible. If pt cannot get worked in sooner, Dr. Terrace Arabia will work pt in here for an LP. Jenel Lucks will let me know ASAP regarding this.

## 2017-10-31 NOTE — Telephone Encounter (Signed)
Patient is scheduled at [facility] for tomorrow 11/01/17 to arrive at 6:30 Pm. Patient is aware of time and day and will be there tomorrow.  Also since it is pass the 6 week mark for the BUN & Creatine she will need a new one. They said to just put the lab orders in and the hospital will do it. When you get a chance can you put the lab orders in.

## 2017-10-31 NOTE — Telephone Encounter (Signed)
Wendy Clark. Called back and relayed patient is having her LP today and her CT's 10/31/2017 at [facility] . Patient is aware of all Details .   Irving Burton has set up MRI's for 11/01/2017 at [facility] .

## 2017-11-01 ENCOUNTER — Ambulatory Visit (HOSPITAL_COMMUNITY)
Admission: RE | Admit: 2017-11-01 | Discharge: 2017-11-01 | Disposition: A | Discharge status: Home / Self Care | Payer: Medicare Other | Source: Ambulatory Visit | Attending: Neurology | Admitting: Neurology

## 2017-11-01 ENCOUNTER — Telehealth: Payer: Self-pay | Admitting: Neurology

## 2017-11-01 ENCOUNTER — Other Ambulatory Visit: Payer: Self-pay | Admitting: *Deleted

## 2017-11-01 DIAGNOSIS — M6281 Muscle weakness (generalized): Secondary | ICD-10-CM | POA: Diagnosis not present

## 2017-11-01 DIAGNOSIS — M50221 Other cervical disc displacement at C4-C5 level: Secondary | ICD-10-CM | POA: Diagnosis not present

## 2017-11-01 DIAGNOSIS — M4802 Spinal stenosis, cervical region: Secondary | ICD-10-CM | POA: Diagnosis not present

## 2017-11-01 DIAGNOSIS — R531 Weakness: Secondary | ICD-10-CM

## 2017-11-01 MED ORDER — GADOBUTROL 1 MMOL/ML IV SOLN
7.0000 mL | Freq: Once | INTRAVENOUS | Status: AC | PRN
  Administered 2017-11-01: 7 mL via INTRAVENOUS

## 2017-11-01 NOTE — Telephone Encounter (Signed)
I called [facility] [phone]) and spoke to [facility].  The only lab available to add to patient's CSF was Chad Nile Antibodies, IgG, IgM.  Per vo by Dr. Terrace Arabia, ok for this particular test to added.  The request has been completed.

## 2017-11-01 NOTE — Telephone Encounter (Signed)
Please add on west nile pcr to her csf

## 2017-11-02 ENCOUNTER — Telehealth: Payer: Self-pay | Admitting: Neurology

## 2017-11-02 NOTE — Telephone Encounter (Signed)
Please call patient, repeat MRI of the brain and cervical spine showed no significant abnormality.  Spinal fluid testing has not completed yet, but the parameters that came back were all normal, there is no evidence of infection, inflammatory process.

## 2017-11-02 NOTE — Telephone Encounter (Signed)
Spoke to patient - she is aware of the results below and verbalized understanding. 

## 2017-11-05 ENCOUNTER — Emergency Department (HOSPITAL_COMMUNITY)
Admission: EM | Admit: 2017-11-05 | Discharge: 2017-11-05 | Disposition: A | Discharge status: Home / Self Care | Payer: Medicare Other | Attending: Emergency Medicine | Admitting: Emergency Medicine

## 2017-11-05 ENCOUNTER — Emergency Department (HOSPITAL_COMMUNITY): Payer: Medicare Other

## 2017-11-05 ENCOUNTER — Other Ambulatory Visit: Payer: Self-pay

## 2017-11-05 ENCOUNTER — Encounter (HOSPITAL_COMMUNITY): Payer: Self-pay

## 2017-11-05 ENCOUNTER — Emergency Department (HOSPITAL_BASED_OUTPATIENT_CLINIC_OR_DEPARTMENT_OTHER): Payer: Medicare Other

## 2017-11-05 DIAGNOSIS — Z87891 Personal history of nicotine dependence: Secondary | ICD-10-CM | POA: Insufficient documentation

## 2017-11-05 DIAGNOSIS — R52 Pain, unspecified: Secondary | ICD-10-CM | POA: Diagnosis not present

## 2017-11-05 DIAGNOSIS — M79605 Pain in left leg: Secondary | ICD-10-CM | POA: Diagnosis not present

## 2017-11-05 DIAGNOSIS — Z96652 Presence of left artificial knee joint: Secondary | ICD-10-CM | POA: Insufficient documentation

## 2017-11-05 DIAGNOSIS — R2242 Localized swelling, mass and lump, left lower limb: Secondary | ICD-10-CM | POA: Insufficient documentation

## 2017-11-05 DIAGNOSIS — M25552 Pain in left hip: Secondary | ICD-10-CM | POA: Diagnosis not present

## 2017-11-05 DIAGNOSIS — Z7982 Long term (current) use of aspirin: Secondary | ICD-10-CM | POA: Insufficient documentation

## 2017-11-05 DIAGNOSIS — Z79899 Other long term (current) drug therapy: Secondary | ICD-10-CM | POA: Diagnosis not present

## 2017-11-05 LAB — CBC
HEMATOCRIT: 38.8 % (ref 36.0–46.0)
Hemoglobin: 11.8 g/dL — ABNORMAL LOW (ref 12.0–15.0)
MCH: 27.4 pg (ref 26.0–34.0)
MCHC: 30.4 g/dL (ref 30.0–36.0)
MCV: 90.2 fL (ref 80.0–100.0)
PLATELETS: 249 10*3/uL (ref 150–400)
RBC: 4.3 MIL/uL (ref 3.87–5.11)
RDW: 13.7 % (ref 11.5–15.5)
WBC: 6.1 10*3/uL (ref 4.0–10.5)
nRBC: 0 % (ref 0.0–0.2)

## 2017-11-05 LAB — BASIC METABOLIC PANEL
Anion gap: 8 (ref 5–15)
BUN: 21 mg/dL (ref 8–23)
CO2: 23 mmol/L (ref 22–32)
Calcium: 9.2 mg/dL (ref 8.9–10.3)
Chloride: 107 mmol/L (ref 98–111)
Creatinine, Ser: 0.61 mg/dL (ref 0.44–1.00)
GFR calc Af Amer: >60 mL/min (ref >60)
GLUCOSE: 99 mg/dL (ref 70–99)
POTASSIUM: 4 mmol/L (ref 3.5–5.1)
Sodium: 138 mmol/L (ref 135–145)

## 2017-11-05 MED ORDER — HYDROCODONE-ACETAMINOPHEN 5-325 MG PO TABS
1.0000 | ORAL_TABLET | Freq: Once | ORAL | Status: AC
  Administered 2017-11-05: 1 via ORAL
  Filled 2017-11-05: qty 1

## 2017-11-05 MED ORDER — PREDNISONE 20 MG PO TABS: 40.0000 mg | ORAL_TABLET | Freq: Every day | ORAL | 0 refills | Status: AC

## 2017-11-05 NOTE — ED Provider Notes (Signed)
[facility] EMERGENCY DEPARTMENT Provider Note   CSN: [id number] Arrival date & time: 11/05/17  4098     History   Chief Complaint Chief Complaint  Patient presents with  . Leg Pain    left sided     HPI Wendy Clark is a 67 y.o. female.  HPI 67 year old female comes in with chief complaint of leg pain. Patient has history of DJD of the lumbar spine and left-sided knee replacement in July comes in with chief complaint of leg pain.  Patient reports that she recently underwent LP because of her increasing weakness in her lower extremity.  2 days after the LP, which is about 3 days ago, she started having spasms and pain in her left leg.  Over time her symptoms have gotten worse and she thinks that her leg is more swollen compared to usual.  Pain is primarily located over the posterior thigh.  She denies any chest pain, shortness of breath.  Patient's pain has increased over time therefore she is concerned that she might have a DVT.  Past Medical History:  Diagnosis Date  . Allergic rhinitis   . Arthritis    oa  . Headache    occ stress HA   . Osteoporosis   . Tremors of nervous system   . Urinary urgency     Patient Active Problem List   Diagnosis Date Noted  . Weakness of both arms 10/20/2017  . Weakness of both legs 10/20/2017  . Right foot drop 10/20/2017  . Gait abnormality 10/20/2017  . Primary osteoarthritis of left knee 07/17/2017  . Degenerative arthritis of left knee 07/13/2017    Past Surgical History:  Procedure Laterality Date  . NO PAST SURGERIES    . 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 Authorizing Provider  acetaminophen (TYLENOL) 325 MG tablet Take 650 mg by mouth  every 6 (six) hours as needed for mild pain.   Yes [provider]  aspirin 325 MG tablet Take 650 mg by mouth once.   Yes [provider]  Calcium Carbonate-Vitamin D (CALCIUM-D PO) Take 1 tablet by mouth 2 (two) times daily.   Yes [provider]  cholecalciferol (VITAMIN D) 1000 units tablet Take 1,000 Units by mouth 2 (two) times daily.   Yes [provider]  ferrous sulfate 325 (65 FE) MG tablet Take 325 mg by mouth daily with breakfast.   Yes [provider]  FLUoxetine (PROZAC) 20 MG capsule Take 20 mg by mouth daily.   Yes [provider]  gabapentin (NEURONTIN) 600 MG tablet Take 300 mg by mouth once.   Yes [provider]  loratadine (CLARITIN) 10 MG tablet Take 10 mg by mouth daily as needed for allergies.    Yes [provider]  Omega-3 Fatty Acids (FISH OIL) 1200 MG CAPS Take 1,200 mg by mouth every evening.    Yes [provider]    Family History History reviewed. No pertinent family history.  Social History Social History   Tobacco Use  . Smoking status: Former Smoker    Years: 15.00    Types: Cigarettes  . Smokeless tobacco: Never Used  Substance Use Topics  .  Alcohol use: Yes    Comment: 2 glasses red wine 1-2 times a month socially   . Drug use: Not Currently    Comment: unsure of quit date but states i havent smoked ina while      Allergies   Novocain [procaine]   Review of Systems Review of Systems  Constitutional: Positive for activity change.  Respiratory: Negative for shortness of breath.   Cardiovascular: Negative for chest pain.  Musculoskeletal: Positive for back pain and gait problem.  Hematological: Does not bruise/bleed easily.     Physical Exam Updated Vital Signs BP 111/65   Pulse 63   Temp 98 F (36.7 C) (Oral)   Resp 18   SpO2 99%   Physical Exam  Constitutional: She is oriented to person, place, and time. She appears well-developed.  HENT:  Head:  Normocephalic and atraumatic.  Eyes: EOM are normal.  Neck: Normal range of motion. Neck supple.  Cardiovascular: Normal rate.  Pulmonary/Chest: Effort normal.  Abdominal: Bowel sounds are normal.  Musculoskeletal: She exhibits edema.  Unilateral left lower extremity edema. No tenderness to palpation  Neurological: She is alert and oriented to person, place, and time.  Skin: Skin is warm and dry.  Nursing note and vitals reviewed.    ED Treatments / Results  Labs (all labs ordered are listed, but only abnormal results are displayed) Labs Reviewed  CBC - Abnormal; Notable for the following components:      Result Value   Hemoglobin 11.8 (*)    All other components within normal limits  BASIC METABOLIC PANEL    EKG None  Radiology Dg Hip Unilat W Or Wo Pelvis 2-3 Views Left  Result Date: 11/05/2017 CLINICAL DATA:  Left hip pain. EXAM: DG HIP (WITH OR WITHOUT PELVIS) 2-3V LEFT COMPARISON:  None. FINDINGS: The cortical margins of the bony pelvis and left hip are intact. No fracture. Pubic symphysis and sacroiliac joints are congruent. No evidence of focal lesion or bony destructive change. Both femoral heads are well-seated in the respective acetabula. IMPRESSION: Negative radiographs of the pelvis and left hip. Electronically Signed   By: Narda Rutherford M.D.   On: 11/05/2017 06:59    Procedures Procedures (including critical care time)  Medications Ordered in ED Medications  HYDROcodone-acetaminophen (NORCO/VICODIN) 5-325 MG per tablet 1 tablet (1 tablet Oral Given 11/05/17 1610)     Initial Impression / Assessment and Plan / ED Course  I have reviewed the triage vital signs and the nursing notes.  Pertinent labs & imaging results that were available during my care of the patient were reviewed by me and considered in my medical decision making (see chart for details).  Clinical Course as of Nov 05 732  Wynelle Link Nov 05, 2017  9604 10/2017 MRI IMPRESSION: This MRI of the  lumbar spine shows multilevel degenerative changes as detailed above.  The most significant findings are: 1.    At L4-L5, there is mild spinal stenosis due to disc protrusion, facet hypertrophy and ligamenta flava hypertrophy.  These degenerative changes causing moderately severe right foraminal narrowing and moderately severe left lateral recess stenosis with potential for right L4 and left L5 nerve root compression.  Degenerative changes have mildly progressed when compared to the 05/02/2015 MRI. 2.    At the other lumbar levels, there are degenerative changes that do not lead to nerve root compression or spinal stenosis.   [AN]    Clinical Course User Index [AN] Derwood Kaplan, MD   67 year old female comes in  with chief complaint of left lower extremity swelling and pain. She had a knee replacement surgery about 4 months ago.  Patient has been having worsening weakness in her left lower extremity and has undergone multiple MRIs and a recent lumbar puncture.  Her MRI showed significant degenerative spine disease.  At the moment she is coming in with symptoms that are concerning for radiculopathy.  However she is also complaining of worsening leg swelling, therefore in the setting of recent surgery will get an ultrasound DVT.  X-ray was done by triage, and it is negative for any acute process over the left hip. Patient's care will be signed out to the incoming team. Patient is aware that if her ultrasound is negative, then she will have to see her PCP for further care.   Final Clinical Impressions(s) / ED Diagnoses   Final diagnoses:  None    ED Discharge Orders    None       Derwood Kaplan, MD 11/05/17 [phone]

## 2017-11-05 NOTE — ED Notes (Signed)
Pt. To XRAY via stretcher. 

## 2017-11-05 NOTE — ED Provider Notes (Signed)
Venous duplex negative for DVT. pcp follow up   Azalia Bilis, MD 11/05/17 [phone]

## 2017-11-05 NOTE — ED Notes (Signed)
Pt stable, ambulatory, and verbalizes understanding of d/c instructions.  

## 2017-11-05 NOTE — ED Triage Notes (Signed)
Pt had spinal tap done Tuesday and today went to sleep and when she lays flat her left leg cramps and goes tight.  Denies any chest pain or shortness of breath.  A&Ox4.  Took baby ASA at 2200.

## 2017-11-05 NOTE — Progress Notes (Signed)
VASCULAR LAB PRELIMINARY  PRELIMINARY  PRELIMINARY  PRELIMINARY  Left lower extremity venous duplex completed.    Preliminary report:  There is no DVT or SVT noted in the left lower extremity.  Gave report to [facility], PA-C  Laylani Pudwill, RVT 11/05/2017, 8:40 AM

## 2017-11-05 NOTE — ED Notes (Signed)
Patient with Ultrasound 

## 2017-11-05 NOTE — ED Notes (Signed)
Pt. Ambulated to bathroom with walker. Pt. Had steady gait and was bearing weight on left leg

## 2017-11-06 ENCOUNTER — Telehealth: Payer: Self-pay | Admitting: Neurology

## 2017-11-06 LAB — VITAMIN B12: Vitamin B-12: 899 pg/mL (ref 232–1245)

## 2017-11-06 LAB — PARANEOPLASTIC PROFILE 1
Neuronal Nuclear (Hu) Antibody (IB): <1:10 {titer}
Purkinje Cell (Yo) Autoantobodies- IFA: <1:10 {titer}

## 2017-11-06 LAB — TSH: TSH: 1.82 u[IU]/mL (ref 0.450–4.500)

## 2017-11-06 LAB — IMMUNOFIXATION ELECTROPHORESIS
IGA/IMMUNOGLOBULIN A, SERUM: 248 mg/dL (ref 87–352)
IGM (IMMUNOGLOBULIN M), SRM: 139 mg/dL (ref 26–217)
IgG (Immunoglobin G), Serum: 832 mg/dL (ref 700–1600)
TOTAL PROTEIN: 6.4 g/dL (ref 6.0–8.5)

## 2017-11-06 LAB — GM1 ANTIBODY IGG, IGM: GM 1 IgG: <1:100 {titer}

## 2017-11-06 LAB — HEPATITIS PANEL, ACUTE
HEP B S AG: NEGATIVE
Hep A IgM: NEGATIVE
Hep B C IgM: NEGATIVE
Hep C Virus Ab: <0.1 s/co ratio (ref 0.0–0.9)

## 2017-11-06 LAB — HGB A1C W/O EAG: HEMOGLOBIN A1C: 5.7 % — AB (ref 4.8–5.6)

## 2017-11-06 LAB — VITAMIN D 25 HYDROXY (VIT D DEFICIENCY, FRACTURES): Vit D, 25-Hydroxy: 50.9 ng/mL (ref 30.0–100.0)

## 2017-11-06 LAB — B. BURGDORFI ANTIBODIES: Lyme IgG/IgM Ab: <0.91 {ISR} (ref 0.00–0.90)

## 2017-11-06 LAB — HIV ANTIBODY (ROUTINE TESTING W REFLEX): HIV SCREEN 4TH GENERATION: NONREACTIVE

## 2017-11-06 LAB — COPPER, SERUM: Copper: 113 ug/dL (ref 72–166)

## 2017-11-06 LAB — FERRITIN: FERRITIN: 139 ng/mL (ref 15–150)

## 2017-11-06 MED ORDER — GABAPENTIN 300 MG PO CAPS: 300.0000 mg | ORAL_CAPSULE | Freq: Three times a day (TID) | ORAL | 11 refills | Status: AC

## 2017-11-06 NOTE — Addendum Note (Signed)
Addended by: Levert Feinstein on: 11/06/2017 12:16 PM   Modules accepted: Orders

## 2017-11-06 NOTE — Telephone Encounter (Addendum)
Dr. Terrace Arabia,  Thank you for ordering all the tests. I hope they are helping you narrow down the cause of my weakness.  I am grateful that most are in the "normal" range. However, I am desperate to know a diagnosis so that we can work on treatments.  It is increasingly difficult for me to write, to use eating utensils, to swing my legs into bed or turn over in bed, to control my bladder, and to dress.   Last night (11/04/17), I could not sleep due to spasms in my left thigh--from my left buttock down the back of my hamstring and ending at the inside of my left knee. During a spasm, the pain was an 8. The spasms occurred when I laid flat, and were frequent...every 5". I went to [facility] ER at midnight, concerned about blood clots as my leg had been sore since the spinal tap bed rest. They did tests and found no clots. The ER doc looked at my spine MRIs and thought maybe my nerve root was inflamed, and prescribed Prednizone for 3 days.  What have you ruled out? What Dx is being considered? S   Please call patient, extensive laboratory evaluations, including spinal tap, and imaging study, failed to demonstrate etiology of her muscle weakness, hyperreflexia,  1.  Muscle spasm increased from abnormal motor unit hyperexcitement, I Rx gabapentin 300 mg 3 times daily to help her muscle cramps  2.  Please make sure she keep [facility] appointment, if she develops swallowing difficulty, breathing difficulty, she may consider [facility] emergency room evaluation to expedite the evaluation

## 2017-11-06 NOTE — Telephone Encounter (Signed)
Spoke to patient - she is aware of Dr. Zannie Cove instructions below.   She verbalized understanding and was in agreement with this plan.

## 2017-11-07 DIAGNOSIS — R531 Weakness: Secondary | ICD-10-CM | POA: Diagnosis not present

## 2017-11-07 DIAGNOSIS — Z23 Encounter for immunization: Secondary | ICD-10-CM | POA: Diagnosis not present

## 2017-11-07 DIAGNOSIS — M25551 Pain in right hip: Secondary | ICD-10-CM | POA: Diagnosis not present

## 2017-11-07 DIAGNOSIS — R350 Frequency of micturition: Secondary | ICD-10-CM | POA: Diagnosis not present

## 2017-11-07 DIAGNOSIS — M25562 Pain in left knee: Secondary | ICD-10-CM | POA: Diagnosis not present

## 2017-11-08 ENCOUNTER — Encounter

## 2017-11-08 ENCOUNTER — Encounter: Payer: Medicare Other | Admitting: Neurology

## 2017-11-09 DIAGNOSIS — Z79899 Other long term (current) drug therapy: Secondary | ICD-10-CM | POA: Diagnosis not present

## 2017-11-09 DIAGNOSIS — F482 Pseudobulbar affect: Secondary | ICD-10-CM | POA: Diagnosis not present

## 2017-11-09 DIAGNOSIS — G1221 Amyotrophic lateral sclerosis: Secondary | ICD-10-CM | POA: Diagnosis not present

## 2017-11-13 ENCOUNTER — Other Ambulatory Visit: Payer: Medicare Other

## 2017-11-16 ENCOUNTER — Telehealth: Payer: Self-pay | Admitting: Neurology

## 2017-11-16 DIAGNOSIS — G1221 Amyotrophic lateral sclerosis: Secondary | ICD-10-CM

## 2017-11-16 NOTE — Telephone Encounter (Signed)
I referred her to [facility] Dr. 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 Can they see her soon

## 2017-11-20 NOTE — Telephone Encounter (Signed)
Sent to Dr. Zannie Kehr telephone [phone] - fax [phone]  Called Patient and told her to take her MRI disc as well.

## 2017-11-21 ENCOUNTER — Encounter: Payer: Self-pay | Admitting: Neurology

## 2017-11-21 ENCOUNTER — Ambulatory Visit (INDEPENDENT_AMBULATORY_CARE_PROVIDER_SITE_OTHER): Payer: Medicare Other | Admitting: Neurology

## 2017-11-21 VITALS — BP 92/68 | HR 80

## 2017-11-21 DIAGNOSIS — G1220 Motor neuron disease, unspecified: Secondary | ICD-10-CM

## 2017-11-21 NOTE — Progress Notes (Signed)
PATIENT: Wendy Clark DOB: [birth year]  Chief Complaint  Patient presents with  . ALS    She is here, with her husband,Tim.  She was seen by Dr, Vallarie Mare at [facility] on 11/09/17.  He started her on Baclofen 20m, 0.5 tab BID for cramps/spasms.  He also prescribed Rilutek but she stopped after taking one dose.  Stated it caused increased weakness, worsened her imbalance and she felt foggy minded.  [facility].     HISTORICAL  SCHIANTI GOHis a 89year old female, seen in request by my colleague Dr. ARexene Albertsfor evaluation of subacute onset muscle weakness, initial evaluation was on October 20, 2017.  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  She was initially evaluated by Dr. ARexene Albertsin February 2019 for intermittent bilateral hands posturing tremor, there was no significant muscle weakness noted by patient no examination at that time.  She had left knee replacement on July 17, 2017, post surgically, she recovered very well, but in early August 2019, she noticed weakness of bilateral hand muscle, difficulty opening a package, also noticed bilateral arm weakness when putting dishes away in the cabinet, later herself, and also noted by physical therapy, that she has bilateral lower extremity weakness, right foot drop, progressive rapidly since then, with noticeable change with each physical therapy session, she also noticed mild slurred speech, she denies significant sensory loss, no double vision,  She presented to the emergency room on September 3 for above symptoms, I personally reviewed films, the head, brain MRI showed no significant abnormality, MRI cervical spine showed multilevel degenerative changes, but there was no evidence of significant canal stenosis, variable degree of  foraminal narrowing, at C 5-6,  Laboratory evaluations: Mild elevated CPK 348, normal and negative troponin, CMP, CBC, myasthenia gravis panel,  She denies muscle fasciculations,  UPDATE Nov 6th 2019: She is accompanied by her husband at today's clinical visit.  She has pseudobulbar affect, continue has slow worsening bilateral upper, lower extremity weakness, gait abnormality, dysarthria, occasionally choking episode, shortness of breath sensation,  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  We reviewed extensive lab evaluations showed normal or negative CPK, myasthenia gravis panel, vitamin B1, C-reactive protein, ESR, RPR, rheumatoid factor, ANA, Lyme titer,Paraneoplastic panel, HIV,B12, hepatitis panel, TSH, Ferritin, copper,Vitamin D, immunofixative protein electrophoresis, GM 1 antibody, musk antibody, mild elevated A1c 5.7.  Spinal fluid testing, WBC of 2, glucose of 66, protein of 50, VDRL was nonreactive, West Nile antibody was negative, CMV, herpes PCR was negative,  I personally reviewed MRIs in October 2019:  MRI of brain showed no significant abnormality, cervical spine mild degenerative changes.  Lumbar spine showed degenerative changes no significant canal foraminal stenosis.  Thoracic spine showed no significant abnormality.  CT of abdomen, pelvic, chest, showed no significant pathology.   REVIEW OF SYSTEMS: Full 14 system review of systems performed and notable only for as above All other review of systems were negative.  ALLERGIES: Allergies  Allergen Reactions  . Novocain [Procaine]     pt stated, "I get faint, pass out, light-headed"    HOME MEDICATIONS: Current Outpatient Medications  Medication Sig Dispense Refill  . acetaminophen (TYLENOL) 325 MG tablet Take 650 mg by mouth  every 6 (six) hours as needed for mild pain.    Marland Kitchen aspirin 325 MG tablet Take 650 mg by mouth once.      . baclofen (LIORESAL) 10 MG tablet Take 5 mg by mouth 2 (two) times daily.    . Calcium Carbonate-Vitamin D (CALCIUM-D PO) Take 1 tablet by mouth 2 (two) times daily.    . cholecalciferol (VITAMIN D) 1000 units tablet Take 1,000 Units by mouth 2 (two) times daily.    . ferrous sulfate 325 (65 FE) MG tablet Take 325 mg by mouth daily with breakfast.    . FLUoxetine (PROZAC) 20 MG capsule Take 20 mg by mouth daily.    Marland Kitchen gabapentin (NEURONTIN) 300 MG capsule Take 1 capsule (300 mg total) by mouth 3 (three) times daily. 90 capsule 11  . gabapentin (NEURONTIN) 600 MG tablet Take 300 mg by mouth once.    . loratadine (CLARITIN) 10 MG tablet Take 10 mg by mouth daily as needed for allergies.     . Omega-3 Fatty Acids (FISH OIL) 1200 MG CAPS Take 1,200 mg by mouth every evening.      No current facility-administered medications for this visit.     PAST MEDICAL HISTORY: Past Medical History:  Diagnosis Date  . Allergic rhinitis   . Arthritis    oa  . Headache    occ stress HA   . Osteoporosis   . Tremors of nervous system   . Urinary urgency     PAST SURGICAL HISTORY: Past Surgical History:  Procedure Laterality Date  . NO PAST SURGERIES    . TOTAL KNEE ARTHROPLASTY Left 07/17/2017   Procedure: LEFT TOTAL KNEE ARTHROPLASTY;  Surgeon: Frederik Pear, MD;  Location: [facility];  Service: Orthopedics;  Laterality: Left;    FAMILY HISTORY: History reviewed. No pertinent family history.  SOCIAL HISTORY: Social History   Socioeconomic History  . Marital status: Married    Spouse name: Not on file  . Number of children: Not on file  . Years of education: Not on file  . Highest education level: Not on file  Occupational History  . Not on file  Social Needs  . Financial resource strain: Not on file  . Food insecurity:    Worry: Not on file    Inability: Not on file  . Transportation needs:    Medical: Not on file    Non-medical: Not on file  Tobacco Use  . Smoking status: Former  Smoker    Years: 15.00    Types: Cigarettes  . Smokeless tobacco: Never Used  Substance and Sexual Activity  . Alcohol use: Yes    Comment: 2 glasses red wine 1-2 times a month socially   . Drug use: Not Currently    Comment: unsure of quit date but states i havent smoked ina while   . Sexual activity: Not on file  Lifestyle  . Physical activity:    Days per week: Not on file    Minutes per session: Not on file  . Stress: Not on file  Relationships  . Social connections:    Talks on phone: Not on file    Gets together: Not on file    Attends religious service: Not on file    Active member of club or organization: Not on file    Attends meetings of clubs or organizations: Not on file    Relationship status: Not on file  . Intimate partner violence:    Fear of current or ex  partner: Not on file    Emotionally abused: Not on file    Physically abused: Not on file    Forced sexual activity: Not on file  Other Topics Concern  . Not on file  Social History Narrative  . Not on file     PHYSICAL EXAM   Vitals:   11/21/17 1125  BP: 92/68  Pulse: 80    Not recorded      There is no height or weight on file to calculate BMI.  PHYSICAL EXAMNIATION:  Gen: NAD, conversant, well nourised, obese, well groomed                     Cardiovascular: Regular rate rhythm, no peripheral edema, warm, nontender. Eyes: Conjunctivae clear without exudates or hemorrhage Neck: Supple, no carotid bruits. Pulmonary: Clear to auscultation bilaterally   NEUROLOGICAL EXAM:  MENTAL STATUS: Speech:    Mild slow spastic slurred speech, with normal comprehension.  Cognition:     Orientation to time, place and person     Normal recent and remote memory     Normal Attention span and concentration     Normal Language, naming, repeating,spontaneous speech     Fund of knowledge   CRANIAL NERVES: CN II: Visual fields are full to confrontation.Pupils are round equal and briskly reactive to  light. CN III, IV, VI: extraocular movement are normal. No ptosis. CN V: Facial sensation is intact to pinprick in all 3 divisions bilaterally. Corneal responses are intact.  CN VII: Mild to moderate bilateral eye closure, cheek puff weakness, CN VIII: Hearing is normal to rubbing fingers CN IX, X: Palate elevates symmetrically. Phonation is normal. CN XI: Head turning and shoulder shrug are intact CN XII: tongue atrophy and fasciculation, slow spastic tongue movement, with mild tongue protrusion into cheek muscle weakness,  MOTOR: I did not see significant muscle fasciculations,  bilateral hand intrinsic muscle atrophy, she has mild to moderate neck flexion, bilateral shoulder abduction weakness, mild to moderate bilateral elbow flexion, extension, wrist flexion/extension, muscle weakness, mild to moderate bilateral hip flexion weakness, there was no significant bilateral knee flexion or extension weakness, significant bilateral foot drop, ankle dorsiflexion (R/L) 2/3+, ankle plantarflexion 3/4  REFLEXES: Reflexes are 3 and symmetric at the biceps, triceps, knees, and ankles. Plantar responses are extensor bilaterally, bilateral Hoffmann sign,  SENSORY: Intact to light touch, pinprick, positional sensation and vibratory sensation are intact in fingers and toes.  COORDINATION: Rapid alternating movements and fine finger movements are intact. There is no dysmetria on finger-to-nose and heel-knee-shin.    GAIT/STANCE: Relies on her walker to get up from seated position, bilateral foot drop, worsening on the right side, unsteady gait,  DIAGNOSTIC DATA (LABS, IMAGING, TESTING) - I reviewed patient records, labs, notes, testing and imaging myself where available.   ASSESSMENT AND PLAN  Wendy Clark is a 67 y.o. female   Painless subacute onset rapid progressive worsening muscle atrophy, weakness, involving bulbar, bilateral cervical, lumbosacral myotomes.  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  Consider laboratory evaluation, imaging study did not identify any treatable etiology,  Diagnosis is also confirmed by a neuromuscular specialist at [facility] Dr. Tedra Coupe  She could not tolerate Rilutk  Refer her to [facility] per patient request.  I also provide information about PEG tube if she desires to proceed, she will contact our office.   Face to face  time was 50 minutes, greater than 50% of the time was spent in counseling and coordination of care with the patient.     Marcial Pacas, M.D. Ph.D.  [facility] [address] [place], [place] [address] Ph: [phone] Fax: [phone]  CC: Dr. Rexene Alberts

## 2017-11-29 LAB — CSF CELL COUNT WITH DIFFERENTIAL
RBC COUNT CSF: 0 {cells}/uL (ref 0–10)
WBC, CSF: 2 cells/uL (ref 0–5)

## 2017-11-29 LAB — WEST NILE AB, IGG AND IGM, CSF
West Nile Ab, IgG, CSF: <1.3 {index} (ref <1.30)
West Nile Ab, IgM, CSF: <0.9 {index} (ref <0.90)

## 2017-11-29 LAB — GRAM STAIN
MICRO NUMBER:: 91238206
SPECIMEN QUALITY:: ADEQUATE

## 2017-11-29 LAB — LYME DISEASE ABS IGG, IGM, IFA, CSF
Lyme Disease AB (IgG), IBL: NOT DETECTED
Lyme Disease AB (IgM), IBL: NOT DETECTED

## 2017-11-29 LAB — FUNGUS CULTURE W SMEAR
MICRO NUMBER: 91238207
SMEAR:: NONE SEEN
SPECIMEN QUALITY:: ADEQUATE

## 2017-11-29 LAB — TEST AUTHORIZATION

## 2017-11-29 LAB — WEST NILE ANTIBODIES, IGG AND IGM

## 2017-11-29 LAB — HERPES SIMPLEX VIRUS 1/2 (IGG), CSF: HSV 1 IgG Index:: <0.01

## 2017-11-29 LAB — CMV (CYTOMEGALOVIRUS) DNA ULTRAQUANT, PCR
CMV DNA, QN PCR: <2.3 {Log_IU}/mL (ref <2.30)
CMV DNA, QN PCR: <200 [IU]/mL (ref <200)

## 2017-11-29 LAB — VDRL, CSF: SYPHILIS VDRL QUANT CSF: NONREACTIVE

## 2017-11-29 LAB — PROTEIN, CSF: Total Protein, CSF: 50 mg/dL (ref 15–60)

## 2017-11-29 LAB — GLUCOSE, CSF: Glucose, CSF: 66 mg/dL (ref 40–80)

## 2017-12-06 DIAGNOSIS — R2689 Other abnormalities of gait and mobility: Secondary | ICD-10-CM | POA: Diagnosis not present

## 2017-12-06 DIAGNOSIS — Z79899 Other long term (current) drug therapy: Secondary | ICD-10-CM | POA: Diagnosis not present

## 2017-12-06 DIAGNOSIS — R296 Repeated falls: Secondary | ICD-10-CM | POA: Diagnosis not present

## 2017-12-06 DIAGNOSIS — Z7409 Other reduced mobility: Secondary | ICD-10-CM | POA: Diagnosis not present

## 2017-12-06 DIAGNOSIS — G1221 Amyotrophic lateral sclerosis: Secondary | ICD-10-CM | POA: Diagnosis not present

## 2017-12-06 DIAGNOSIS — Z9181 History of falling: Secondary | ICD-10-CM | POA: Diagnosis not present

## 2017-12-06 DIAGNOSIS — R531 Weakness: Secondary | ICD-10-CM | POA: Diagnosis not present

## 2018-01-03 ENCOUNTER — Ambulatory Visit: Payer: Medicare Other | Admitting: Neurology

## 2018-01-30 DIAGNOSIS — R609 Edema, unspecified: Secondary | ICD-10-CM | POA: Diagnosis not present

## 2018-01-30 DIAGNOSIS — M79675 Pain in left toe(s): Secondary | ICD-10-CM | POA: Diagnosis not present

## 2018-01-30 DIAGNOSIS — M79674 Pain in right toe(s): Secondary | ICD-10-CM | POA: Diagnosis not present

## 2018-01-30 DIAGNOSIS — L600 Ingrowing nail: Secondary | ICD-10-CM | POA: Diagnosis not present

## 2018-02-01 DIAGNOSIS — S31829A Unspecified open wound of left buttock, initial encounter: Secondary | ICD-10-CM | POA: Diagnosis not present

## 2018-02-01 DIAGNOSIS — J988 Other specified respiratory disorders: Secondary | ICD-10-CM | POA: Diagnosis not present

## 2018-02-01 DIAGNOSIS — G1221 Amyotrophic lateral sclerosis: Secondary | ICD-10-CM | POA: Diagnosis not present

## 2018-02-05 DIAGNOSIS — G1221 Amyotrophic lateral sclerosis: Secondary | ICD-10-CM | POA: Diagnosis not present

## 2018-02-08 DIAGNOSIS — F482 Pseudobulbar affect: Secondary | ICD-10-CM | POA: Diagnosis not present

## 2018-02-08 DIAGNOSIS — G1221 Amyotrophic lateral sclerosis: Secondary | ICD-10-CM | POA: Diagnosis not present

## 2018-02-08 DIAGNOSIS — M5430 Sciatica, unspecified side: Secondary | ICD-10-CM | POA: Diagnosis not present

## 2018-02-08 DIAGNOSIS — R1310 Dysphagia, unspecified: Secondary | ICD-10-CM | POA: Diagnosis not present

## 2018-02-12 DIAGNOSIS — E569 Vitamin deficiency, unspecified: Secondary | ICD-10-CM | POA: Diagnosis not present

## 2018-02-13 ENCOUNTER — Telehealth: Payer: Self-pay | Admitting: Neurology

## 2018-02-13 DIAGNOSIS — F482 Pseudobulbar affect: Secondary | ICD-10-CM | POA: Diagnosis not present

## 2018-02-13 DIAGNOSIS — F339 Major depressive disorder, recurrent, unspecified: Secondary | ICD-10-CM | POA: Diagnosis not present

## 2018-02-13 DIAGNOSIS — G1221 Amyotrophic lateral sclerosis: Secondary | ICD-10-CM | POA: Diagnosis not present

## 2018-02-13 DIAGNOSIS — D638 Anemia in other chronic diseases classified elsewhere: Secondary | ICD-10-CM | POA: Diagnosis not present

## 2018-02-13 NOTE — Telephone Encounter (Signed)
Wendy Clark, not on DPR states she is the POA of this pt but there is no paperwork that has been scanned for the POA. Explained to Wendy Clark that unfortunately we can not speak to her about this pt unless a DPR is signed. Wendy Clark expressed how very unhappy she was with this information and does not understand why she can not be helped regarding this pt. She states the people on the DPR can no longer take care of this pt. Informed Wendy Clark that she would have to have the pt come in and fill out a new DPR. She stated that they now live in [place]. Wendy Clark was not very happy and stated that she "guesses that she will have to start the process all over again" and the call was disconnected.

## 2018-02-13 NOTE — Telephone Encounter (Signed)
Pt POA

## 2018-02-15 DIAGNOSIS — G1221 Amyotrophic lateral sclerosis: Secondary | ICD-10-CM | POA: Diagnosis not present

## 2018-02-16 DIAGNOSIS — M5430 Sciatica, unspecified side: Secondary | ICD-10-CM | POA: Diagnosis not present

## 2018-02-16 DIAGNOSIS — R1310 Dysphagia, unspecified: Secondary | ICD-10-CM | POA: Diagnosis not present

## 2018-02-16 DIAGNOSIS — F482 Pseudobulbar affect: Secondary | ICD-10-CM | POA: Diagnosis not present

## 2018-02-16 DIAGNOSIS — G1221 Amyotrophic lateral sclerosis: Secondary | ICD-10-CM | POA: Diagnosis not present

## 2018-02-19 DIAGNOSIS — G1221 Amyotrophic lateral sclerosis: Secondary | ICD-10-CM | POA: Diagnosis not present

## 2018-02-20 DIAGNOSIS — F482 Pseudobulbar affect: Secondary | ICD-10-CM | POA: Diagnosis not present

## 2018-02-20 DIAGNOSIS — G1221 Amyotrophic lateral sclerosis: Secondary | ICD-10-CM | POA: Diagnosis not present

## 2018-02-20 DIAGNOSIS — F339 Major depressive disorder, recurrent, unspecified: Secondary | ICD-10-CM | POA: Diagnosis not present

## 2018-02-20 DIAGNOSIS — R531 Weakness: Secondary | ICD-10-CM | POA: Diagnosis not present

## 2018-02-21 DIAGNOSIS — Z79899 Other long term (current) drug therapy: Secondary | ICD-10-CM | POA: Diagnosis not present

## 2018-02-26 DIAGNOSIS — M25562 Pain in left knee: Secondary | ICD-10-CM | POA: Diagnosis not present

## 2018-02-26 DIAGNOSIS — F482 Pseudobulbar affect: Secondary | ICD-10-CM | POA: Diagnosis not present

## 2018-02-26 DIAGNOSIS — F339 Major depressive disorder, recurrent, unspecified: Secondary | ICD-10-CM | POA: Diagnosis not present

## 2018-02-26 DIAGNOSIS — G1221 Amyotrophic lateral sclerosis: Secondary | ICD-10-CM | POA: Diagnosis not present

## 2018-02-27 DIAGNOSIS — H2513 Age-related nuclear cataract, bilateral: Secondary | ICD-10-CM | POA: Diagnosis not present

## 2018-02-28 DIAGNOSIS — R471 Dysarthria and anarthria: Secondary | ICD-10-CM | POA: Diagnosis not present

## 2018-02-28 DIAGNOSIS — J988 Other specified respiratory disorders: Secondary | ICD-10-CM | POA: Diagnosis not present

## 2018-02-28 DIAGNOSIS — Z7409 Other reduced mobility: Secondary | ICD-10-CM | POA: Diagnosis not present

## 2018-02-28 DIAGNOSIS — M6281 Muscle weakness (generalized): Secondary | ICD-10-CM | POA: Diagnosis not present

## 2018-02-28 DIAGNOSIS — Z01818 Encounter for other preprocedural examination: Secondary | ICD-10-CM | POA: Diagnosis not present

## 2018-02-28 DIAGNOSIS — M1711 Unilateral primary osteoarthritis, right knee: Secondary | ICD-10-CM | POA: Diagnosis not present

## 2018-02-28 DIAGNOSIS — G1221 Amyotrophic lateral sclerosis: Secondary | ICD-10-CM | POA: Diagnosis not present

## 2018-02-28 DIAGNOSIS — R0609 Other forms of dyspnea: Secondary | ICD-10-CM | POA: Diagnosis not present

## 2018-02-28 DIAGNOSIS — R252 Cramp and spasm: Secondary | ICD-10-CM | POA: Diagnosis not present

## 2018-02-28 DIAGNOSIS — R1312 Dysphagia, oropharyngeal phase: Secondary | ICD-10-CM | POA: Diagnosis not present

## 2018-02-28 DIAGNOSIS — M21371 Foot drop, right foot: Secondary | ICD-10-CM | POA: Diagnosis not present

## 2018-02-28 DIAGNOSIS — M21372 Foot drop, left foot: Secondary | ICD-10-CM | POA: Diagnosis not present

## 2018-02-28 DIAGNOSIS — R1319 Other dysphagia: Secondary | ICD-10-CM | POA: Diagnosis not present

## 2018-03-01 DIAGNOSIS — M79605 Pain in left leg: Secondary | ICD-10-CM | POA: Diagnosis not present

## 2018-03-06 DIAGNOSIS — G1221 Amyotrophic lateral sclerosis: Secondary | ICD-10-CM | POA: Diagnosis not present

## 2018-03-08 DIAGNOSIS — R1319 Other dysphagia: Secondary | ICD-10-CM | POA: Diagnosis not present

## 2018-03-08 DIAGNOSIS — G1221 Amyotrophic lateral sclerosis: Secondary | ICD-10-CM | POA: Diagnosis not present

## 2018-03-08 DIAGNOSIS — R1310 Dysphagia, unspecified: Secondary | ICD-10-CM | POA: Diagnosis not present

## 2018-03-09 DIAGNOSIS — G1221 Amyotrophic lateral sclerosis: Secondary | ICD-10-CM | POA: Diagnosis not present

## 2018-03-09 DIAGNOSIS — F339 Major depressive disorder, recurrent, unspecified: Secondary | ICD-10-CM | POA: Diagnosis not present

## 2018-03-09 DIAGNOSIS — R1319 Other dysphagia: Secondary | ICD-10-CM | POA: Diagnosis not present

## 2018-03-09 DIAGNOSIS — F482 Pseudobulbar affect: Secondary | ICD-10-CM | POA: Diagnosis not present

## 2018-03-09 DIAGNOSIS — R109 Unspecified abdominal pain: Secondary | ICD-10-CM | POA: Diagnosis not present

## 2018-03-12 DIAGNOSIS — G1221 Amyotrophic lateral sclerosis: Secondary | ICD-10-CM | POA: Diagnosis not present

## 2018-03-16 DIAGNOSIS — G1221 Amyotrophic lateral sclerosis: Secondary | ICD-10-CM | POA: Diagnosis not present

## 2018-03-20 DIAGNOSIS — G1221 Amyotrophic lateral sclerosis: Secondary | ICD-10-CM | POA: Diagnosis not present

## 2018-03-23 DIAGNOSIS — G1221 Amyotrophic lateral sclerosis: Secondary | ICD-10-CM | POA: Diagnosis not present

## 2018-03-25 DIAGNOSIS — G1221 Amyotrophic lateral sclerosis: Secondary | ICD-10-CM | POA: Diagnosis not present

## 2018-03-27 DIAGNOSIS — G1221 Amyotrophic lateral sclerosis: Secondary | ICD-10-CM | POA: Diagnosis not present

## 2018-03-30 DIAGNOSIS — G1221 Amyotrophic lateral sclerosis: Secondary | ICD-10-CM | POA: Diagnosis not present

## 2018-04-04 DIAGNOSIS — G1221 Amyotrophic lateral sclerosis: Secondary | ICD-10-CM | POA: Diagnosis not present

## 2018-04-06 DIAGNOSIS — G1221 Amyotrophic lateral sclerosis: Secondary | ICD-10-CM | POA: Diagnosis not present

## 2018-04-10 DIAGNOSIS — G1221 Amyotrophic lateral sclerosis: Secondary | ICD-10-CM | POA: Diagnosis not present

## 2018-04-11 DIAGNOSIS — G1221 Amyotrophic lateral sclerosis: Secondary | ICD-10-CM | POA: Diagnosis not present

## 2018-04-11 DIAGNOSIS — F339 Major depressive disorder, recurrent, unspecified: Secondary | ICD-10-CM | POA: Diagnosis not present

## 2018-04-11 DIAGNOSIS — J309 Allergic rhinitis, unspecified: Secondary | ICD-10-CM | POA: Diagnosis not present

## 2018-04-11 DIAGNOSIS — F482 Pseudobulbar affect: Secondary | ICD-10-CM | POA: Diagnosis not present

## 2018-04-13 DIAGNOSIS — G1221 Amyotrophic lateral sclerosis: Secondary | ICD-10-CM | POA: Diagnosis not present

## 2018-04-17 DIAGNOSIS — G1221 Amyotrophic lateral sclerosis: Secondary | ICD-10-CM | POA: Diagnosis not present

## 2018-04-19 DIAGNOSIS — G1221 Amyotrophic lateral sclerosis: Secondary | ICD-10-CM | POA: Diagnosis not present

## 2018-04-20 DIAGNOSIS — G1221 Amyotrophic lateral sclerosis: Secondary | ICD-10-CM | POA: Diagnosis not present

## 2018-04-24 DIAGNOSIS — G1221 Amyotrophic lateral sclerosis: Secondary | ICD-10-CM | POA: Diagnosis not present

## 2018-04-27 DIAGNOSIS — G1221 Amyotrophic lateral sclerosis: Secondary | ICD-10-CM | POA: Diagnosis not present

## 2018-05-01 DIAGNOSIS — G1221 Amyotrophic lateral sclerosis: Secondary | ICD-10-CM | POA: Diagnosis not present

## 2018-05-04 DIAGNOSIS — G1221 Amyotrophic lateral sclerosis: Secondary | ICD-10-CM | POA: Diagnosis not present

## 2018-05-04 DIAGNOSIS — M79605 Pain in left leg: Secondary | ICD-10-CM | POA: Diagnosis not present

## 2018-05-04 DIAGNOSIS — F482 Pseudobulbar affect: Secondary | ICD-10-CM | POA: Diagnosis not present

## 2018-05-04 DIAGNOSIS — F339 Major depressive disorder, recurrent, unspecified: Secondary | ICD-10-CM | POA: Diagnosis not present

## 2018-05-08 DIAGNOSIS — G1221 Amyotrophic lateral sclerosis: Secondary | ICD-10-CM | POA: Diagnosis not present

## 2018-05-11 DIAGNOSIS — G1221 Amyotrophic lateral sclerosis: Secondary | ICD-10-CM | POA: Diagnosis not present

## 2018-05-15 DIAGNOSIS — G1221 Amyotrophic lateral sclerosis: Secondary | ICD-10-CM | POA: Diagnosis not present

## 2018-05-16 DIAGNOSIS — G1221 Amyotrophic lateral sclerosis: Secondary | ICD-10-CM | POA: Diagnosis not present

## 2018-05-16 DIAGNOSIS — F339 Major depressive disorder, recurrent, unspecified: Secondary | ICD-10-CM | POA: Diagnosis not present

## 2018-05-16 DIAGNOSIS — M25562 Pain in left knee: Secondary | ICD-10-CM | POA: Diagnosis not present

## 2018-05-16 DIAGNOSIS — K5901 Slow transit constipation: Secondary | ICD-10-CM | POA: Diagnosis not present

## 2018-05-17 DIAGNOSIS — R0609 Other forms of dyspnea: Secondary | ICD-10-CM | POA: Diagnosis not present

## 2018-05-17 DIAGNOSIS — F482 Pseudobulbar affect: Secondary | ICD-10-CM | POA: Diagnosis not present

## 2018-05-17 DIAGNOSIS — G1221 Amyotrophic lateral sclerosis: Secondary | ICD-10-CM | POA: Diagnosis not present

## 2018-05-17 DIAGNOSIS — R2681 Unsteadiness on feet: Secondary | ICD-10-CM | POA: Diagnosis not present

## 2018-05-17 DIAGNOSIS — R1319 Other dysphagia: Secondary | ICD-10-CM | POA: Diagnosis not present

## 2018-05-18 DIAGNOSIS — R197 Diarrhea, unspecified: Secondary | ICD-10-CM | POA: Diagnosis not present

## 2018-05-18 DIAGNOSIS — F482 Pseudobulbar affect: Secondary | ICD-10-CM | POA: Diagnosis not present

## 2018-05-18 DIAGNOSIS — K5900 Constipation, unspecified: Secondary | ICD-10-CM | POA: Diagnosis not present

## 2018-05-18 DIAGNOSIS — E559 Vitamin D deficiency, unspecified: Secondary | ICD-10-CM | POA: Diagnosis not present

## 2018-05-18 DIAGNOSIS — R278 Other lack of coordination: Secondary | ICD-10-CM | POA: Diagnosis not present

## 2018-05-18 DIAGNOSIS — R1310 Dysphagia, unspecified: Secondary | ICD-10-CM | POA: Diagnosis not present

## 2018-05-18 DIAGNOSIS — G1221 Amyotrophic lateral sclerosis: Secondary | ICD-10-CM | POA: Diagnosis not present

## 2018-05-18 DIAGNOSIS — J309 Allergic rhinitis, unspecified: Secondary | ICD-10-CM | POA: Diagnosis not present

## 2018-05-18 DIAGNOSIS — R488 Other symbolic dysfunctions: Secondary | ICD-10-CM | POA: Diagnosis not present

## 2018-05-18 DIAGNOSIS — Z741 Need for assistance with personal care: Secondary | ICD-10-CM | POA: Diagnosis not present

## 2018-05-18 DIAGNOSIS — F339 Major depressive disorder, recurrent, unspecified: Secondary | ICD-10-CM | POA: Diagnosis not present

## 2018-05-18 DIAGNOSIS — R21 Rash and other nonspecific skin eruption: Secondary | ICD-10-CM | POA: Diagnosis not present

## 2018-05-18 DIAGNOSIS — E611 Iron deficiency: Secondary | ICD-10-CM | POA: Diagnosis not present

## 2018-05-18 DIAGNOSIS — R52 Pain, unspecified: Secondary | ICD-10-CM | POA: Diagnosis not present

## 2018-05-18 DIAGNOSIS — G4733 Obstructive sleep apnea (adult) (pediatric): Secondary | ICD-10-CM | POA: Diagnosis not present

## 2018-05-18 DIAGNOSIS — E569 Vitamin deficiency, unspecified: Secondary | ICD-10-CM | POA: Diagnosis not present

## 2018-05-18 DIAGNOSIS — Z431 Encounter for attention to gastrostomy: Secondary | ICD-10-CM | POA: Diagnosis not present

## 2018-05-19 DIAGNOSIS — Z741 Need for assistance with personal care: Secondary | ICD-10-CM | POA: Diagnosis not present

## 2018-05-19 DIAGNOSIS — R1310 Dysphagia, unspecified: Secondary | ICD-10-CM | POA: Diagnosis not present

## 2018-05-19 DIAGNOSIS — R488 Other symbolic dysfunctions: Secondary | ICD-10-CM | POA: Diagnosis not present

## 2018-05-19 DIAGNOSIS — G1221 Amyotrophic lateral sclerosis: Secondary | ICD-10-CM | POA: Diagnosis not present

## 2018-05-19 DIAGNOSIS — E559 Vitamin D deficiency, unspecified: Secondary | ICD-10-CM | POA: Diagnosis not present

## 2018-05-19 DIAGNOSIS — R278 Other lack of coordination: Secondary | ICD-10-CM | POA: Diagnosis not present

## 2018-05-20 DIAGNOSIS — R278 Other lack of coordination: Secondary | ICD-10-CM | POA: Diagnosis not present

## 2018-05-20 DIAGNOSIS — Z741 Need for assistance with personal care: Secondary | ICD-10-CM | POA: Diagnosis not present

## 2018-05-20 DIAGNOSIS — E559 Vitamin D deficiency, unspecified: Secondary | ICD-10-CM | POA: Diagnosis not present

## 2018-05-20 DIAGNOSIS — G1221 Amyotrophic lateral sclerosis: Secondary | ICD-10-CM | POA: Diagnosis not present

## 2018-05-20 DIAGNOSIS — R1310 Dysphagia, unspecified: Secondary | ICD-10-CM | POA: Diagnosis not present

## 2018-05-20 DIAGNOSIS — R488 Other symbolic dysfunctions: Secondary | ICD-10-CM | POA: Diagnosis not present

## 2018-05-22 DIAGNOSIS — G1221 Amyotrophic lateral sclerosis: Secondary | ICD-10-CM | POA: Diagnosis not present

## 2018-05-22 DIAGNOSIS — R1310 Dysphagia, unspecified: Secondary | ICD-10-CM | POA: Diagnosis not present

## 2018-05-22 DIAGNOSIS — R488 Other symbolic dysfunctions: Secondary | ICD-10-CM | POA: Diagnosis not present

## 2018-05-22 DIAGNOSIS — Z741 Need for assistance with personal care: Secondary | ICD-10-CM | POA: Diagnosis not present

## 2018-05-22 DIAGNOSIS — E559 Vitamin D deficiency, unspecified: Secondary | ICD-10-CM | POA: Diagnosis not present

## 2018-05-22 DIAGNOSIS — R278 Other lack of coordination: Secondary | ICD-10-CM | POA: Diagnosis not present

## 2018-05-23 DIAGNOSIS — E559 Vitamin D deficiency, unspecified: Secondary | ICD-10-CM | POA: Diagnosis not present

## 2018-05-23 DIAGNOSIS — R278 Other lack of coordination: Secondary | ICD-10-CM | POA: Diagnosis not present

## 2018-05-23 DIAGNOSIS — G1221 Amyotrophic lateral sclerosis: Secondary | ICD-10-CM | POA: Diagnosis not present

## 2018-05-23 DIAGNOSIS — R1310 Dysphagia, unspecified: Secondary | ICD-10-CM | POA: Diagnosis not present

## 2018-05-23 DIAGNOSIS — Z741 Need for assistance with personal care: Secondary | ICD-10-CM | POA: Diagnosis not present

## 2018-05-23 DIAGNOSIS — R488 Other symbolic dysfunctions: Secondary | ICD-10-CM | POA: Diagnosis not present

## 2018-05-25 DIAGNOSIS — Z741 Need for assistance with personal care: Secondary | ICD-10-CM | POA: Diagnosis not present

## 2018-05-25 DIAGNOSIS — R278 Other lack of coordination: Secondary | ICD-10-CM | POA: Diagnosis not present

## 2018-05-25 DIAGNOSIS — E559 Vitamin D deficiency, unspecified: Secondary | ICD-10-CM | POA: Diagnosis not present

## 2018-05-25 DIAGNOSIS — R488 Other symbolic dysfunctions: Secondary | ICD-10-CM | POA: Diagnosis not present

## 2018-05-25 DIAGNOSIS — R1310 Dysphagia, unspecified: Secondary | ICD-10-CM | POA: Diagnosis not present

## 2018-05-25 DIAGNOSIS — G1221 Amyotrophic lateral sclerosis: Secondary | ICD-10-CM | POA: Diagnosis not present

## 2018-05-29 DIAGNOSIS — Z741 Need for assistance with personal care: Secondary | ICD-10-CM | POA: Diagnosis not present

## 2018-05-29 DIAGNOSIS — G1221 Amyotrophic lateral sclerosis: Secondary | ICD-10-CM | POA: Diagnosis not present

## 2018-05-29 DIAGNOSIS — R488 Other symbolic dysfunctions: Secondary | ICD-10-CM | POA: Diagnosis not present

## 2018-05-29 DIAGNOSIS — E559 Vitamin D deficiency, unspecified: Secondary | ICD-10-CM | POA: Diagnosis not present

## 2018-05-29 DIAGNOSIS — R278 Other lack of coordination: Secondary | ICD-10-CM | POA: Diagnosis not present

## 2018-05-29 DIAGNOSIS — R1310 Dysphagia, unspecified: Secondary | ICD-10-CM | POA: Diagnosis not present

## 2018-05-30 DIAGNOSIS — G1221 Amyotrophic lateral sclerosis: Secondary | ICD-10-CM | POA: Diagnosis not present

## 2018-05-30 DIAGNOSIS — R1310 Dysphagia, unspecified: Secondary | ICD-10-CM | POA: Diagnosis not present

## 2018-05-30 DIAGNOSIS — R488 Other symbolic dysfunctions: Secondary | ICD-10-CM | POA: Diagnosis not present

## 2018-05-30 DIAGNOSIS — Z741 Need for assistance with personal care: Secondary | ICD-10-CM | POA: Diagnosis not present

## 2018-05-30 DIAGNOSIS — R278 Other lack of coordination: Secondary | ICD-10-CM | POA: Diagnosis not present

## 2018-05-30 DIAGNOSIS — E559 Vitamin D deficiency, unspecified: Secondary | ICD-10-CM | POA: Diagnosis not present

## 2018-05-31 DIAGNOSIS — G1221 Amyotrophic lateral sclerosis: Secondary | ICD-10-CM | POA: Diagnosis not present

## 2018-05-31 DIAGNOSIS — R1310 Dysphagia, unspecified: Secondary | ICD-10-CM | POA: Diagnosis not present

## 2018-05-31 DIAGNOSIS — Z741 Need for assistance with personal care: Secondary | ICD-10-CM | POA: Diagnosis not present

## 2018-05-31 DIAGNOSIS — E559 Vitamin D deficiency, unspecified: Secondary | ICD-10-CM | POA: Diagnosis not present

## 2018-05-31 DIAGNOSIS — R488 Other symbolic dysfunctions: Secondary | ICD-10-CM | POA: Diagnosis not present

## 2018-05-31 DIAGNOSIS — R278 Other lack of coordination: Secondary | ICD-10-CM | POA: Diagnosis not present

## 2018-06-01 DIAGNOSIS — R1310 Dysphagia, unspecified: Secondary | ICD-10-CM | POA: Diagnosis not present

## 2018-06-01 DIAGNOSIS — E559 Vitamin D deficiency, unspecified: Secondary | ICD-10-CM | POA: Diagnosis not present

## 2018-06-01 DIAGNOSIS — R278 Other lack of coordination: Secondary | ICD-10-CM | POA: Diagnosis not present

## 2018-06-01 DIAGNOSIS — Z741 Need for assistance with personal care: Secondary | ICD-10-CM | POA: Diagnosis not present

## 2018-06-01 DIAGNOSIS — R488 Other symbolic dysfunctions: Secondary | ICD-10-CM | POA: Diagnosis not present

## 2018-06-01 DIAGNOSIS — G1221 Amyotrophic lateral sclerosis: Secondary | ICD-10-CM | POA: Diagnosis not present

## 2018-06-02 DIAGNOSIS — Z741 Need for assistance with personal care: Secondary | ICD-10-CM | POA: Diagnosis not present

## 2018-06-02 DIAGNOSIS — R278 Other lack of coordination: Secondary | ICD-10-CM | POA: Diagnosis not present

## 2018-06-02 DIAGNOSIS — R1310 Dysphagia, unspecified: Secondary | ICD-10-CM | POA: Diagnosis not present

## 2018-06-02 DIAGNOSIS — E559 Vitamin D deficiency, unspecified: Secondary | ICD-10-CM | POA: Diagnosis not present

## 2018-06-02 DIAGNOSIS — R488 Other symbolic dysfunctions: Secondary | ICD-10-CM | POA: Diagnosis not present

## 2018-06-02 DIAGNOSIS — G1221 Amyotrophic lateral sclerosis: Secondary | ICD-10-CM | POA: Diagnosis not present

## 2018-06-04 DIAGNOSIS — R488 Other symbolic dysfunctions: Secondary | ICD-10-CM | POA: Diagnosis not present

## 2018-06-04 DIAGNOSIS — Z741 Need for assistance with personal care: Secondary | ICD-10-CM | POA: Diagnosis not present

## 2018-06-04 DIAGNOSIS — R278 Other lack of coordination: Secondary | ICD-10-CM | POA: Diagnosis not present

## 2018-06-04 DIAGNOSIS — G1221 Amyotrophic lateral sclerosis: Secondary | ICD-10-CM | POA: Diagnosis not present

## 2018-06-04 DIAGNOSIS — E559 Vitamin D deficiency, unspecified: Secondary | ICD-10-CM | POA: Diagnosis not present

## 2018-06-04 DIAGNOSIS — R1310 Dysphagia, unspecified: Secondary | ICD-10-CM | POA: Diagnosis not present

## 2018-06-05 DIAGNOSIS — Z741 Need for assistance with personal care: Secondary | ICD-10-CM | POA: Diagnosis not present

## 2018-06-05 DIAGNOSIS — E559 Vitamin D deficiency, unspecified: Secondary | ICD-10-CM | POA: Diagnosis not present

## 2018-06-05 DIAGNOSIS — G1221 Amyotrophic lateral sclerosis: Secondary | ICD-10-CM | POA: Diagnosis not present

## 2018-06-05 DIAGNOSIS — R1310 Dysphagia, unspecified: Secondary | ICD-10-CM | POA: Diagnosis not present

## 2018-06-05 DIAGNOSIS — R488 Other symbolic dysfunctions: Secondary | ICD-10-CM | POA: Diagnosis not present

## 2018-06-05 DIAGNOSIS — R278 Other lack of coordination: Secondary | ICD-10-CM | POA: Diagnosis not present

## 2018-06-06 DIAGNOSIS — R488 Other symbolic dysfunctions: Secondary | ICD-10-CM | POA: Diagnosis not present

## 2018-06-06 DIAGNOSIS — G1221 Amyotrophic lateral sclerosis: Secondary | ICD-10-CM | POA: Diagnosis not present

## 2018-06-06 DIAGNOSIS — E559 Vitamin D deficiency, unspecified: Secondary | ICD-10-CM | POA: Diagnosis not present

## 2018-06-06 DIAGNOSIS — R1310 Dysphagia, unspecified: Secondary | ICD-10-CM | POA: Diagnosis not present

## 2018-06-06 DIAGNOSIS — Z741 Need for assistance with personal care: Secondary | ICD-10-CM | POA: Diagnosis not present

## 2018-06-06 DIAGNOSIS — R278 Other lack of coordination: Secondary | ICD-10-CM | POA: Diagnosis not present

## 2018-06-07 DIAGNOSIS — R1310 Dysphagia, unspecified: Secondary | ICD-10-CM | POA: Diagnosis not present

## 2018-06-07 DIAGNOSIS — R488 Other symbolic dysfunctions: Secondary | ICD-10-CM | POA: Diagnosis not present

## 2018-06-07 DIAGNOSIS — Z741 Need for assistance with personal care: Secondary | ICD-10-CM | POA: Diagnosis not present

## 2018-06-07 DIAGNOSIS — E559 Vitamin D deficiency, unspecified: Secondary | ICD-10-CM | POA: Diagnosis not present

## 2018-06-07 DIAGNOSIS — G1221 Amyotrophic lateral sclerosis: Secondary | ICD-10-CM | POA: Diagnosis not present

## 2018-06-07 DIAGNOSIS — R278 Other lack of coordination: Secondary | ICD-10-CM | POA: Diagnosis not present

## 2018-06-08 DIAGNOSIS — Z741 Need for assistance with personal care: Secondary | ICD-10-CM | POA: Diagnosis not present

## 2018-06-08 DIAGNOSIS — R488 Other symbolic dysfunctions: Secondary | ICD-10-CM | POA: Diagnosis not present

## 2018-06-08 DIAGNOSIS — R278 Other lack of coordination: Secondary | ICD-10-CM | POA: Diagnosis not present

## 2018-06-08 DIAGNOSIS — F482 Pseudobulbar affect: Secondary | ICD-10-CM | POA: Diagnosis not present

## 2018-06-08 DIAGNOSIS — F339 Major depressive disorder, recurrent, unspecified: Secondary | ICD-10-CM | POA: Diagnosis not present

## 2018-06-08 DIAGNOSIS — G1221 Amyotrophic lateral sclerosis: Secondary | ICD-10-CM | POA: Diagnosis not present

## 2018-06-08 DIAGNOSIS — R1310 Dysphagia, unspecified: Secondary | ICD-10-CM | POA: Diagnosis not present

## 2018-06-08 DIAGNOSIS — E559 Vitamin D deficiency, unspecified: Secondary | ICD-10-CM | POA: Diagnosis not present

## 2018-06-11 DIAGNOSIS — R278 Other lack of coordination: Secondary | ICD-10-CM | POA: Diagnosis not present

## 2018-06-11 DIAGNOSIS — R1310 Dysphagia, unspecified: Secondary | ICD-10-CM | POA: Diagnosis not present

## 2018-06-11 DIAGNOSIS — Z741 Need for assistance with personal care: Secondary | ICD-10-CM | POA: Diagnosis not present

## 2018-06-11 DIAGNOSIS — R488 Other symbolic dysfunctions: Secondary | ICD-10-CM | POA: Diagnosis not present

## 2018-06-11 DIAGNOSIS — G1221 Amyotrophic lateral sclerosis: Secondary | ICD-10-CM | POA: Diagnosis not present

## 2018-06-11 DIAGNOSIS — E559 Vitamin D deficiency, unspecified: Secondary | ICD-10-CM | POA: Diagnosis not present

## 2018-06-12 DIAGNOSIS — R488 Other symbolic dysfunctions: Secondary | ICD-10-CM | POA: Diagnosis not present

## 2018-06-12 DIAGNOSIS — E559 Vitamin D deficiency, unspecified: Secondary | ICD-10-CM | POA: Diagnosis not present

## 2018-06-12 DIAGNOSIS — G1221 Amyotrophic lateral sclerosis: Secondary | ICD-10-CM | POA: Diagnosis not present

## 2018-06-12 DIAGNOSIS — R278 Other lack of coordination: Secondary | ICD-10-CM | POA: Diagnosis not present

## 2018-06-12 DIAGNOSIS — Z741 Need for assistance with personal care: Secondary | ICD-10-CM | POA: Diagnosis not present

## 2018-06-12 DIAGNOSIS — R1310 Dysphagia, unspecified: Secondary | ICD-10-CM | POA: Diagnosis not present

## 2018-06-13 DIAGNOSIS — G1221 Amyotrophic lateral sclerosis: Secondary | ICD-10-CM | POA: Diagnosis not present

## 2018-06-13 DIAGNOSIS — F339 Major depressive disorder, recurrent, unspecified: Secondary | ICD-10-CM | POA: Diagnosis not present

## 2018-06-13 DIAGNOSIS — M62838 Other muscle spasm: Secondary | ICD-10-CM | POA: Diagnosis not present

## 2018-06-13 DIAGNOSIS — R488 Other symbolic dysfunctions: Secondary | ICD-10-CM | POA: Diagnosis not present

## 2018-06-13 DIAGNOSIS — K2900 Acute gastritis without bleeding: Secondary | ICD-10-CM | POA: Diagnosis not present

## 2018-06-13 DIAGNOSIS — E559 Vitamin D deficiency, unspecified: Secondary | ICD-10-CM | POA: Diagnosis not present

## 2018-06-13 DIAGNOSIS — Z741 Need for assistance with personal care: Secondary | ICD-10-CM | POA: Diagnosis not present

## 2018-06-13 DIAGNOSIS — R278 Other lack of coordination: Secondary | ICD-10-CM | POA: Diagnosis not present

## 2018-06-13 DIAGNOSIS — R1310 Dysphagia, unspecified: Secondary | ICD-10-CM | POA: Diagnosis not present

## 2018-06-13 DIAGNOSIS — Z931 Gastrostomy status: Secondary | ICD-10-CM | POA: Diagnosis not present

## 2018-06-14 DIAGNOSIS — Z741 Need for assistance with personal care: Secondary | ICD-10-CM | POA: Diagnosis not present

## 2018-06-14 DIAGNOSIS — G1221 Amyotrophic lateral sclerosis: Secondary | ICD-10-CM | POA: Diagnosis not present

## 2018-06-14 DIAGNOSIS — E559 Vitamin D deficiency, unspecified: Secondary | ICD-10-CM | POA: Diagnosis not present

## 2018-06-14 DIAGNOSIS — E611 Iron deficiency: Secondary | ICD-10-CM | POA: Diagnosis not present

## 2018-06-14 DIAGNOSIS — R52 Pain, unspecified: Secondary | ICD-10-CM | POA: Diagnosis not present

## 2018-06-14 DIAGNOSIS — E569 Vitamin deficiency, unspecified: Secondary | ICD-10-CM | POA: Diagnosis not present

## 2018-06-14 DIAGNOSIS — R278 Other lack of coordination: Secondary | ICD-10-CM | POA: Diagnosis not present

## 2018-06-14 DIAGNOSIS — R1310 Dysphagia, unspecified: Secondary | ICD-10-CM | POA: Diagnosis not present

## 2018-06-14 DIAGNOSIS — R488 Other symbolic dysfunctions: Secondary | ICD-10-CM | POA: Diagnosis not present

## 2018-06-15 DIAGNOSIS — R488 Other symbolic dysfunctions: Secondary | ICD-10-CM | POA: Diagnosis not present

## 2018-06-15 DIAGNOSIS — M62838 Other muscle spasm: Secondary | ICD-10-CM | POA: Diagnosis not present

## 2018-06-15 DIAGNOSIS — Z741 Need for assistance with personal care: Secondary | ICD-10-CM | POA: Diagnosis not present

## 2018-06-15 DIAGNOSIS — E559 Vitamin D deficiency, unspecified: Secondary | ICD-10-CM | POA: Diagnosis not present

## 2018-06-15 DIAGNOSIS — G1221 Amyotrophic lateral sclerosis: Secondary | ICD-10-CM | POA: Diagnosis not present

## 2018-06-15 DIAGNOSIS — M79605 Pain in left leg: Secondary | ICD-10-CM | POA: Diagnosis not present

## 2018-06-15 DIAGNOSIS — R278 Other lack of coordination: Secondary | ICD-10-CM | POA: Diagnosis not present

## 2018-06-15 DIAGNOSIS — R1310 Dysphagia, unspecified: Secondary | ICD-10-CM | POA: Diagnosis not present

## 2018-06-18 DIAGNOSIS — G1221 Amyotrophic lateral sclerosis: Secondary | ICD-10-CM | POA: Diagnosis not present

## 2018-06-19 DIAGNOSIS — G1221 Amyotrophic lateral sclerosis: Secondary | ICD-10-CM | POA: Diagnosis not present

## 2018-06-20 DIAGNOSIS — G1221 Amyotrophic lateral sclerosis: Secondary | ICD-10-CM | POA: Diagnosis not present

## 2018-06-22 DIAGNOSIS — G1221 Amyotrophic lateral sclerosis: Secondary | ICD-10-CM | POA: Diagnosis not present

## 2018-06-24 DIAGNOSIS — G1221 Amyotrophic lateral sclerosis: Secondary | ICD-10-CM | POA: Diagnosis not present

## 2018-06-26 DIAGNOSIS — G1221 Amyotrophic lateral sclerosis: Secondary | ICD-10-CM | POA: Diagnosis not present

## 2018-06-28 DIAGNOSIS — G1221 Amyotrophic lateral sclerosis: Secondary | ICD-10-CM | POA: Diagnosis not present

## 2018-06-29 DIAGNOSIS — G1221 Amyotrophic lateral sclerosis: Secondary | ICD-10-CM | POA: Diagnosis not present

## 2018-07-02 DIAGNOSIS — G1221 Amyotrophic lateral sclerosis: Secondary | ICD-10-CM | POA: Diagnosis not present

## 2018-07-03 DIAGNOSIS — G1221 Amyotrophic lateral sclerosis: Secondary | ICD-10-CM | POA: Diagnosis not present

## 2018-07-05 DIAGNOSIS — G1221 Amyotrophic lateral sclerosis: Secondary | ICD-10-CM | POA: Diagnosis not present

## 2018-07-06 DIAGNOSIS — G1221 Amyotrophic lateral sclerosis: Secondary | ICD-10-CM | POA: Diagnosis not present

## 2018-07-09 DIAGNOSIS — G1221 Amyotrophic lateral sclerosis: Secondary | ICD-10-CM | POA: Diagnosis not present

## 2018-07-10 DIAGNOSIS — G1221 Amyotrophic lateral sclerosis: Secondary | ICD-10-CM | POA: Diagnosis not present

## 2018-07-11 DIAGNOSIS — G1221 Amyotrophic lateral sclerosis: Secondary | ICD-10-CM | POA: Diagnosis not present

## 2018-07-13 DIAGNOSIS — G1221 Amyotrophic lateral sclerosis: Secondary | ICD-10-CM | POA: Diagnosis not present

## 2018-07-16 DIAGNOSIS — G1221 Amyotrophic lateral sclerosis: Secondary | ICD-10-CM | POA: Diagnosis not present

## 2018-07-17 DIAGNOSIS — G1221 Amyotrophic lateral sclerosis: Secondary | ICD-10-CM | POA: Diagnosis not present

## 2018-07-18 DIAGNOSIS — K5900 Constipation, unspecified: Secondary | ICD-10-CM | POA: Diagnosis not present

## 2018-07-18 DIAGNOSIS — F482 Pseudobulbar affect: Secondary | ICD-10-CM | POA: Diagnosis not present

## 2018-07-18 DIAGNOSIS — M6281 Muscle weakness (generalized): Secondary | ICD-10-CM | POA: Diagnosis not present

## 2018-07-18 DIAGNOSIS — F339 Major depressive disorder, recurrent, unspecified: Secondary | ICD-10-CM | POA: Diagnosis not present

## 2018-07-18 DIAGNOSIS — E559 Vitamin D deficiency, unspecified: Secondary | ICD-10-CM | POA: Diagnosis not present

## 2018-07-18 DIAGNOSIS — R197 Diarrhea, unspecified: Secondary | ICD-10-CM | POA: Diagnosis not present

## 2018-07-18 DIAGNOSIS — E569 Vitamin deficiency, unspecified: Secondary | ICD-10-CM | POA: Diagnosis not present

## 2018-07-18 DIAGNOSIS — Z741 Need for assistance with personal care: Secondary | ICD-10-CM | POA: Diagnosis not present

## 2018-07-18 DIAGNOSIS — R278 Other lack of coordination: Secondary | ICD-10-CM | POA: Diagnosis not present

## 2018-07-18 DIAGNOSIS — Z431 Encounter for attention to gastrostomy: Secondary | ICD-10-CM | POA: Diagnosis not present

## 2018-07-18 DIAGNOSIS — R488 Other symbolic dysfunctions: Secondary | ICD-10-CM | POA: Diagnosis not present

## 2018-07-18 DIAGNOSIS — E611 Iron deficiency: Secondary | ICD-10-CM | POA: Diagnosis not present

## 2018-07-18 DIAGNOSIS — R52 Pain, unspecified: Secondary | ICD-10-CM | POA: Diagnosis not present

## 2018-07-18 DIAGNOSIS — R21 Rash and other nonspecific skin eruption: Secondary | ICD-10-CM | POA: Diagnosis not present

## 2018-07-18 DIAGNOSIS — G1221 Amyotrophic lateral sclerosis: Secondary | ICD-10-CM | POA: Diagnosis not present

## 2018-07-18 DIAGNOSIS — R1310 Dysphagia, unspecified: Secondary | ICD-10-CM | POA: Diagnosis not present

## 2018-07-18 DIAGNOSIS — J309 Allergic rhinitis, unspecified: Secondary | ICD-10-CM | POA: Diagnosis not present

## 2018-07-19 DIAGNOSIS — M6281 Muscle weakness (generalized): Secondary | ICD-10-CM | POA: Diagnosis not present

## 2018-07-19 DIAGNOSIS — R488 Other symbolic dysfunctions: Secondary | ICD-10-CM | POA: Diagnosis not present

## 2018-07-19 DIAGNOSIS — R278 Other lack of coordination: Secondary | ICD-10-CM | POA: Diagnosis not present

## 2018-07-19 DIAGNOSIS — Z741 Need for assistance with personal care: Secondary | ICD-10-CM | POA: Diagnosis not present

## 2018-07-19 DIAGNOSIS — G1221 Amyotrophic lateral sclerosis: Secondary | ICD-10-CM | POA: Diagnosis not present

## 2018-07-19 DIAGNOSIS — R1310 Dysphagia, unspecified: Secondary | ICD-10-CM | POA: Diagnosis not present

## 2018-07-20 DIAGNOSIS — G1221 Amyotrophic lateral sclerosis: Secondary | ICD-10-CM | POA: Diagnosis not present

## 2018-07-20 DIAGNOSIS — R1310 Dysphagia, unspecified: Secondary | ICD-10-CM | POA: Diagnosis not present

## 2018-07-20 DIAGNOSIS — R488 Other symbolic dysfunctions: Secondary | ICD-10-CM | POA: Diagnosis not present

## 2018-07-20 DIAGNOSIS — M6281 Muscle weakness (generalized): Secondary | ICD-10-CM | POA: Diagnosis not present

## 2018-07-20 DIAGNOSIS — Z741 Need for assistance with personal care: Secondary | ICD-10-CM | POA: Diagnosis not present

## 2018-07-20 DIAGNOSIS — R278 Other lack of coordination: Secondary | ICD-10-CM | POA: Diagnosis not present

## 2018-07-23 DIAGNOSIS — G1221 Amyotrophic lateral sclerosis: Secondary | ICD-10-CM | POA: Diagnosis not present

## 2018-07-23 DIAGNOSIS — M6281 Muscle weakness (generalized): Secondary | ICD-10-CM | POA: Diagnosis not present

## 2018-07-23 DIAGNOSIS — Z741 Need for assistance with personal care: Secondary | ICD-10-CM | POA: Diagnosis not present

## 2018-07-23 DIAGNOSIS — R1310 Dysphagia, unspecified: Secondary | ICD-10-CM | POA: Diagnosis not present

## 2018-07-23 DIAGNOSIS — R278 Other lack of coordination: Secondary | ICD-10-CM | POA: Diagnosis not present

## 2018-07-23 DIAGNOSIS — R488 Other symbolic dysfunctions: Secondary | ICD-10-CM | POA: Diagnosis not present

## 2018-07-24 DIAGNOSIS — M6281 Muscle weakness (generalized): Secondary | ICD-10-CM | POA: Diagnosis not present

## 2018-07-24 DIAGNOSIS — Z741 Need for assistance with personal care: Secondary | ICD-10-CM | POA: Diagnosis not present

## 2018-07-24 DIAGNOSIS — R278 Other lack of coordination: Secondary | ICD-10-CM | POA: Diagnosis not present

## 2018-07-24 DIAGNOSIS — R488 Other symbolic dysfunctions: Secondary | ICD-10-CM | POA: Diagnosis not present

## 2018-07-24 DIAGNOSIS — R1310 Dysphagia, unspecified: Secondary | ICD-10-CM | POA: Diagnosis not present

## 2018-07-24 DIAGNOSIS — G1221 Amyotrophic lateral sclerosis: Secondary | ICD-10-CM | POA: Diagnosis not present

## 2018-07-27 DIAGNOSIS — R1310 Dysphagia, unspecified: Secondary | ICD-10-CM | POA: Diagnosis not present

## 2018-07-27 DIAGNOSIS — M6281 Muscle weakness (generalized): Secondary | ICD-10-CM | POA: Diagnosis not present

## 2018-07-27 DIAGNOSIS — R278 Other lack of coordination: Secondary | ICD-10-CM | POA: Diagnosis not present

## 2018-07-27 DIAGNOSIS — Z741 Need for assistance with personal care: Secondary | ICD-10-CM | POA: Diagnosis not present

## 2018-07-27 DIAGNOSIS — G1221 Amyotrophic lateral sclerosis: Secondary | ICD-10-CM | POA: Diagnosis not present

## 2018-07-27 DIAGNOSIS — R488 Other symbolic dysfunctions: Secondary | ICD-10-CM | POA: Diagnosis not present

## 2018-07-30 DIAGNOSIS — R278 Other lack of coordination: Secondary | ICD-10-CM | POA: Diagnosis not present

## 2018-07-30 DIAGNOSIS — M6281 Muscle weakness (generalized): Secondary | ICD-10-CM | POA: Diagnosis not present

## 2018-07-30 DIAGNOSIS — Z741 Need for assistance with personal care: Secondary | ICD-10-CM | POA: Diagnosis not present

## 2018-07-30 DIAGNOSIS — R1310 Dysphagia, unspecified: Secondary | ICD-10-CM | POA: Diagnosis not present

## 2018-07-30 DIAGNOSIS — G1221 Amyotrophic lateral sclerosis: Secondary | ICD-10-CM | POA: Diagnosis not present

## 2018-07-30 DIAGNOSIS — R488 Other symbolic dysfunctions: Secondary | ICD-10-CM | POA: Diagnosis not present

## 2018-07-31 DIAGNOSIS — M6281 Muscle weakness (generalized): Secondary | ICD-10-CM | POA: Diagnosis not present

## 2018-07-31 DIAGNOSIS — R488 Other symbolic dysfunctions: Secondary | ICD-10-CM | POA: Diagnosis not present

## 2018-07-31 DIAGNOSIS — R278 Other lack of coordination: Secondary | ICD-10-CM | POA: Diagnosis not present

## 2018-07-31 DIAGNOSIS — R1310 Dysphagia, unspecified: Secondary | ICD-10-CM | POA: Diagnosis not present

## 2018-07-31 DIAGNOSIS — Z741 Need for assistance with personal care: Secondary | ICD-10-CM | POA: Diagnosis not present

## 2018-07-31 DIAGNOSIS — G1221 Amyotrophic lateral sclerosis: Secondary | ICD-10-CM | POA: Diagnosis not present

## 2018-08-01 DIAGNOSIS — G1221 Amyotrophic lateral sclerosis: Secondary | ICD-10-CM | POA: Diagnosis not present

## 2018-08-01 DIAGNOSIS — R278 Other lack of coordination: Secondary | ICD-10-CM | POA: Diagnosis not present

## 2018-08-01 DIAGNOSIS — R488 Other symbolic dysfunctions: Secondary | ICD-10-CM | POA: Diagnosis not present

## 2018-08-01 DIAGNOSIS — Z741 Need for assistance with personal care: Secondary | ICD-10-CM | POA: Diagnosis not present

## 2018-08-01 DIAGNOSIS — M6281 Muscle weakness (generalized): Secondary | ICD-10-CM | POA: Diagnosis not present

## 2018-08-01 DIAGNOSIS — R1310 Dysphagia, unspecified: Secondary | ICD-10-CM | POA: Diagnosis not present

## 2018-08-03 DIAGNOSIS — M6281 Muscle weakness (generalized): Secondary | ICD-10-CM | POA: Diagnosis not present

## 2018-08-03 DIAGNOSIS — Z741 Need for assistance with personal care: Secondary | ICD-10-CM | POA: Diagnosis not present

## 2018-08-03 DIAGNOSIS — R1310 Dysphagia, unspecified: Secondary | ICD-10-CM | POA: Diagnosis not present

## 2018-08-03 DIAGNOSIS — G1221 Amyotrophic lateral sclerosis: Secondary | ICD-10-CM | POA: Diagnosis not present

## 2018-08-03 DIAGNOSIS — R488 Other symbolic dysfunctions: Secondary | ICD-10-CM | POA: Diagnosis not present

## 2018-08-03 DIAGNOSIS — R278 Other lack of coordination: Secondary | ICD-10-CM | POA: Diagnosis not present

## 2018-08-06 DIAGNOSIS — M6281 Muscle weakness (generalized): Secondary | ICD-10-CM | POA: Diagnosis not present

## 2018-08-06 DIAGNOSIS — R488 Other symbolic dysfunctions: Secondary | ICD-10-CM | POA: Diagnosis not present

## 2018-08-06 DIAGNOSIS — R1310 Dysphagia, unspecified: Secondary | ICD-10-CM | POA: Diagnosis not present

## 2018-08-06 DIAGNOSIS — R278 Other lack of coordination: Secondary | ICD-10-CM | POA: Diagnosis not present

## 2018-08-06 DIAGNOSIS — Z741 Need for assistance with personal care: Secondary | ICD-10-CM | POA: Diagnosis not present

## 2018-08-06 DIAGNOSIS — G1221 Amyotrophic lateral sclerosis: Secondary | ICD-10-CM | POA: Diagnosis not present

## 2018-08-07 DIAGNOSIS — G1221 Amyotrophic lateral sclerosis: Secondary | ICD-10-CM | POA: Diagnosis not present

## 2018-08-07 DIAGNOSIS — Z741 Need for assistance with personal care: Secondary | ICD-10-CM | POA: Diagnosis not present

## 2018-08-07 DIAGNOSIS — M6281 Muscle weakness (generalized): Secondary | ICD-10-CM | POA: Diagnosis not present

## 2018-08-07 DIAGNOSIS — R278 Other lack of coordination: Secondary | ICD-10-CM | POA: Diagnosis not present

## 2018-08-07 DIAGNOSIS — R488 Other symbolic dysfunctions: Secondary | ICD-10-CM | POA: Diagnosis not present

## 2018-08-07 DIAGNOSIS — R1310 Dysphagia, unspecified: Secondary | ICD-10-CM | POA: Diagnosis not present

## 2018-08-08 DIAGNOSIS — G1221 Amyotrophic lateral sclerosis: Secondary | ICD-10-CM | POA: Diagnosis not present

## 2018-08-08 DIAGNOSIS — R488 Other symbolic dysfunctions: Secondary | ICD-10-CM | POA: Diagnosis not present

## 2018-08-08 DIAGNOSIS — Z741 Need for assistance with personal care: Secondary | ICD-10-CM | POA: Diagnosis not present

## 2018-08-08 DIAGNOSIS — R278 Other lack of coordination: Secondary | ICD-10-CM | POA: Diagnosis not present

## 2018-08-08 DIAGNOSIS — R1310 Dysphagia, unspecified: Secondary | ICD-10-CM | POA: Diagnosis not present

## 2018-08-08 DIAGNOSIS — M6281 Muscle weakness (generalized): Secondary | ICD-10-CM | POA: Diagnosis not present

## 2018-08-09 DIAGNOSIS — M6281 Muscle weakness (generalized): Secondary | ICD-10-CM | POA: Diagnosis not present

## 2018-08-09 DIAGNOSIS — R1310 Dysphagia, unspecified: Secondary | ICD-10-CM | POA: Diagnosis not present

## 2018-08-09 DIAGNOSIS — R278 Other lack of coordination: Secondary | ICD-10-CM | POA: Diagnosis not present

## 2018-08-09 DIAGNOSIS — G1221 Amyotrophic lateral sclerosis: Secondary | ICD-10-CM | POA: Diagnosis not present

## 2018-08-09 DIAGNOSIS — Z741 Need for assistance with personal care: Secondary | ICD-10-CM | POA: Diagnosis not present

## 2018-08-09 DIAGNOSIS — R488 Other symbolic dysfunctions: Secondary | ICD-10-CM | POA: Diagnosis not present

## 2018-08-10 DIAGNOSIS — G1221 Amyotrophic lateral sclerosis: Secondary | ICD-10-CM | POA: Diagnosis not present

## 2018-08-10 DIAGNOSIS — R278 Other lack of coordination: Secondary | ICD-10-CM | POA: Diagnosis not present

## 2018-08-10 DIAGNOSIS — R488 Other symbolic dysfunctions: Secondary | ICD-10-CM | POA: Diagnosis not present

## 2018-08-10 DIAGNOSIS — M6281 Muscle weakness (generalized): Secondary | ICD-10-CM | POA: Diagnosis not present

## 2018-08-10 DIAGNOSIS — Z741 Need for assistance with personal care: Secondary | ICD-10-CM | POA: Diagnosis not present

## 2018-08-10 DIAGNOSIS — R1310 Dysphagia, unspecified: Secondary | ICD-10-CM | POA: Diagnosis not present

## 2018-08-13 DIAGNOSIS — F339 Major depressive disorder, recurrent, unspecified: Secondary | ICD-10-CM | POA: Diagnosis not present

## 2018-08-13 DIAGNOSIS — F482 Pseudobulbar affect: Secondary | ICD-10-CM | POA: Diagnosis not present

## 2018-08-13 DIAGNOSIS — Z741 Need for assistance with personal care: Secondary | ICD-10-CM | POA: Diagnosis not present

## 2018-08-13 DIAGNOSIS — R278 Other lack of coordination: Secondary | ICD-10-CM | POA: Diagnosis not present

## 2018-08-13 DIAGNOSIS — R488 Other symbolic dysfunctions: Secondary | ICD-10-CM | POA: Diagnosis not present

## 2018-08-13 DIAGNOSIS — R1310 Dysphagia, unspecified: Secondary | ICD-10-CM | POA: Diagnosis not present

## 2018-08-13 DIAGNOSIS — M5430 Sciatica, unspecified side: Secondary | ICD-10-CM | POA: Diagnosis not present

## 2018-08-13 DIAGNOSIS — M6281 Muscle weakness (generalized): Secondary | ICD-10-CM | POA: Diagnosis not present

## 2018-08-13 DIAGNOSIS — G1221 Amyotrophic lateral sclerosis: Secondary | ICD-10-CM | POA: Diagnosis not present

## 2018-08-14 DIAGNOSIS — R488 Other symbolic dysfunctions: Secondary | ICD-10-CM | POA: Diagnosis not present

## 2018-08-14 DIAGNOSIS — G1221 Amyotrophic lateral sclerosis: Secondary | ICD-10-CM | POA: Diagnosis not present

## 2018-08-14 DIAGNOSIS — Z741 Need for assistance with personal care: Secondary | ICD-10-CM | POA: Diagnosis not present

## 2018-08-14 DIAGNOSIS — R278 Other lack of coordination: Secondary | ICD-10-CM | POA: Diagnosis not present

## 2018-08-14 DIAGNOSIS — R1310 Dysphagia, unspecified: Secondary | ICD-10-CM | POA: Diagnosis not present

## 2018-08-14 DIAGNOSIS — M6281 Muscle weakness (generalized): Secondary | ICD-10-CM | POA: Diagnosis not present

## 2018-08-15 DIAGNOSIS — G1221 Amyotrophic lateral sclerosis: Secondary | ICD-10-CM | POA: Diagnosis not present

## 2018-08-15 DIAGNOSIS — Z741 Need for assistance with personal care: Secondary | ICD-10-CM | POA: Diagnosis not present

## 2018-08-15 DIAGNOSIS — R278 Other lack of coordination: Secondary | ICD-10-CM | POA: Diagnosis not present

## 2018-08-15 DIAGNOSIS — M6281 Muscle weakness (generalized): Secondary | ICD-10-CM | POA: Diagnosis not present

## 2018-08-15 DIAGNOSIS — R1310 Dysphagia, unspecified: Secondary | ICD-10-CM | POA: Diagnosis not present

## 2018-08-15 DIAGNOSIS — R488 Other symbolic dysfunctions: Secondary | ICD-10-CM | POA: Diagnosis not present

## 2018-08-16 DIAGNOSIS — Z741 Need for assistance with personal care: Secondary | ICD-10-CM | POA: Diagnosis not present

## 2018-08-16 DIAGNOSIS — G1221 Amyotrophic lateral sclerosis: Secondary | ICD-10-CM | POA: Diagnosis not present

## 2018-08-16 DIAGNOSIS — M6281 Muscle weakness (generalized): Secondary | ICD-10-CM | POA: Diagnosis not present

## 2018-08-16 DIAGNOSIS — R278 Other lack of coordination: Secondary | ICD-10-CM | POA: Diagnosis not present

## 2018-08-16 DIAGNOSIS — R488 Other symbolic dysfunctions: Secondary | ICD-10-CM | POA: Diagnosis not present

## 2018-08-16 DIAGNOSIS — R1310 Dysphagia, unspecified: Secondary | ICD-10-CM | POA: Diagnosis not present

## 2018-08-17 DIAGNOSIS — R278 Other lack of coordination: Secondary | ICD-10-CM | POA: Diagnosis not present

## 2018-08-17 DIAGNOSIS — R488 Other symbolic dysfunctions: Secondary | ICD-10-CM | POA: Diagnosis not present

## 2018-08-17 DIAGNOSIS — M6281 Muscle weakness (generalized): Secondary | ICD-10-CM | POA: Diagnosis not present

## 2018-08-17 DIAGNOSIS — R1310 Dysphagia, unspecified: Secondary | ICD-10-CM | POA: Diagnosis not present

## 2018-08-17 DIAGNOSIS — Z741 Need for assistance with personal care: Secondary | ICD-10-CM | POA: Diagnosis not present

## 2018-08-17 DIAGNOSIS — G1221 Amyotrophic lateral sclerosis: Secondary | ICD-10-CM | POA: Diagnosis not present

## 2018-08-20 DIAGNOSIS — R2689 Other abnormalities of gait and mobility: Secondary | ICD-10-CM | POA: Diagnosis not present

## 2018-08-20 DIAGNOSIS — R488 Other symbolic dysfunctions: Secondary | ICD-10-CM | POA: Diagnosis not present

## 2018-08-20 DIAGNOSIS — Z136 Encounter for screening for cardiovascular disorders: Secondary | ICD-10-CM | POA: Diagnosis not present

## 2018-08-20 DIAGNOSIS — G1221 Amyotrophic lateral sclerosis: Secondary | ICD-10-CM | POA: Diagnosis not present

## 2018-08-20 DIAGNOSIS — Z130 Encounter for screening for diseases of the blood and blood-forming organs and certain disorders involving the immune mechanism: Secondary | ICD-10-CM | POA: Diagnosis not present

## 2018-08-20 DIAGNOSIS — Z13228 Encounter for screening for other metabolic disorders: Secondary | ICD-10-CM | POA: Diagnosis not present

## 2018-08-21 DIAGNOSIS — R2689 Other abnormalities of gait and mobility: Secondary | ICD-10-CM | POA: Diagnosis not present

## 2018-08-21 DIAGNOSIS — R488 Other symbolic dysfunctions: Secondary | ICD-10-CM | POA: Diagnosis not present

## 2018-08-21 DIAGNOSIS — G1221 Amyotrophic lateral sclerosis: Secondary | ICD-10-CM | POA: Diagnosis not present

## 2018-08-22 DIAGNOSIS — G1221 Amyotrophic lateral sclerosis: Secondary | ICD-10-CM | POA: Diagnosis not present

## 2018-08-22 DIAGNOSIS — R488 Other symbolic dysfunctions: Secondary | ICD-10-CM | POA: Diagnosis not present

## 2018-08-22 DIAGNOSIS — R2689 Other abnormalities of gait and mobility: Secondary | ICD-10-CM | POA: Diagnosis not present

## 2018-08-23 DIAGNOSIS — R2689 Other abnormalities of gait and mobility: Secondary | ICD-10-CM | POA: Diagnosis not present

## 2018-08-23 DIAGNOSIS — F482 Pseudobulbar affect: Secondary | ICD-10-CM | POA: Diagnosis not present

## 2018-08-23 DIAGNOSIS — R1319 Other dysphagia: Secondary | ICD-10-CM | POA: Diagnosis not present

## 2018-08-23 DIAGNOSIS — G1221 Amyotrophic lateral sclerosis: Secondary | ICD-10-CM | POA: Diagnosis not present

## 2018-08-23 DIAGNOSIS — R2681 Unsteadiness on feet: Secondary | ICD-10-CM | POA: Diagnosis not present

## 2018-08-23 DIAGNOSIS — R488 Other symbolic dysfunctions: Secondary | ICD-10-CM | POA: Diagnosis not present

## 2018-08-24 DIAGNOSIS — R488 Other symbolic dysfunctions: Secondary | ICD-10-CM | POA: Diagnosis not present

## 2018-08-24 DIAGNOSIS — R2689 Other abnormalities of gait and mobility: Secondary | ICD-10-CM | POA: Diagnosis not present

## 2018-08-24 DIAGNOSIS — G1221 Amyotrophic lateral sclerosis: Secondary | ICD-10-CM | POA: Diagnosis not present

## 2018-08-27 DIAGNOSIS — R488 Other symbolic dysfunctions: Secondary | ICD-10-CM | POA: Diagnosis not present

## 2018-08-27 DIAGNOSIS — R2689 Other abnormalities of gait and mobility: Secondary | ICD-10-CM | POA: Diagnosis not present

## 2018-08-27 DIAGNOSIS — G1221 Amyotrophic lateral sclerosis: Secondary | ICD-10-CM | POA: Diagnosis not present

## 2018-08-28 DIAGNOSIS — R488 Other symbolic dysfunctions: Secondary | ICD-10-CM | POA: Diagnosis not present

## 2018-08-28 DIAGNOSIS — R2689 Other abnormalities of gait and mobility: Secondary | ICD-10-CM | POA: Diagnosis not present

## 2018-08-28 DIAGNOSIS — G1221 Amyotrophic lateral sclerosis: Secondary | ICD-10-CM | POA: Diagnosis not present

## 2018-08-29 DIAGNOSIS — R2689 Other abnormalities of gait and mobility: Secondary | ICD-10-CM | POA: Diagnosis not present

## 2018-08-29 DIAGNOSIS — G1221 Amyotrophic lateral sclerosis: Secondary | ICD-10-CM | POA: Diagnosis not present

## 2018-08-29 DIAGNOSIS — R488 Other symbolic dysfunctions: Secondary | ICD-10-CM | POA: Diagnosis not present

## 2018-08-30 DIAGNOSIS — G1221 Amyotrophic lateral sclerosis: Secondary | ICD-10-CM | POA: Diagnosis not present

## 2018-08-30 DIAGNOSIS — R488 Other symbolic dysfunctions: Secondary | ICD-10-CM | POA: Diagnosis not present

## 2018-08-30 DIAGNOSIS — R2689 Other abnormalities of gait and mobility: Secondary | ICD-10-CM | POA: Diagnosis not present

## 2018-08-31 DIAGNOSIS — R2689 Other abnormalities of gait and mobility: Secondary | ICD-10-CM | POA: Diagnosis not present

## 2018-08-31 DIAGNOSIS — R488 Other symbolic dysfunctions: Secondary | ICD-10-CM | POA: Diagnosis not present

## 2018-08-31 DIAGNOSIS — G1221 Amyotrophic lateral sclerosis: Secondary | ICD-10-CM | POA: Diagnosis not present

## 2018-09-03 DIAGNOSIS — R488 Other symbolic dysfunctions: Secondary | ICD-10-CM | POA: Diagnosis not present

## 2018-09-03 DIAGNOSIS — G1221 Amyotrophic lateral sclerosis: Secondary | ICD-10-CM | POA: Diagnosis not present

## 2018-09-03 DIAGNOSIS — R2689 Other abnormalities of gait and mobility: Secondary | ICD-10-CM | POA: Diagnosis not present

## 2018-09-04 DIAGNOSIS — R488 Other symbolic dysfunctions: Secondary | ICD-10-CM | POA: Diagnosis not present

## 2018-09-04 DIAGNOSIS — G1221 Amyotrophic lateral sclerosis: Secondary | ICD-10-CM | POA: Diagnosis not present

## 2018-09-04 DIAGNOSIS — R2689 Other abnormalities of gait and mobility: Secondary | ICD-10-CM | POA: Diagnosis not present

## 2018-09-05 DIAGNOSIS — R488 Other symbolic dysfunctions: Secondary | ICD-10-CM | POA: Diagnosis not present

## 2018-09-05 DIAGNOSIS — G1221 Amyotrophic lateral sclerosis: Secondary | ICD-10-CM | POA: Diagnosis not present

## 2018-09-05 DIAGNOSIS — R2689 Other abnormalities of gait and mobility: Secondary | ICD-10-CM | POA: Diagnosis not present

## 2018-09-06 DIAGNOSIS — G1221 Amyotrophic lateral sclerosis: Secondary | ICD-10-CM | POA: Diagnosis not present

## 2018-09-06 DIAGNOSIS — R2689 Other abnormalities of gait and mobility: Secondary | ICD-10-CM | POA: Diagnosis not present

## 2018-09-06 DIAGNOSIS — R488 Other symbolic dysfunctions: Secondary | ICD-10-CM | POA: Diagnosis not present

## 2018-09-07 DIAGNOSIS — R488 Other symbolic dysfunctions: Secondary | ICD-10-CM | POA: Diagnosis not present

## 2018-09-07 DIAGNOSIS — G1221 Amyotrophic lateral sclerosis: Secondary | ICD-10-CM | POA: Diagnosis not present

## 2018-09-07 DIAGNOSIS — R2689 Other abnormalities of gait and mobility: Secondary | ICD-10-CM | POA: Diagnosis not present

## 2018-09-10 DIAGNOSIS — R488 Other symbolic dysfunctions: Secondary | ICD-10-CM | POA: Diagnosis not present

## 2018-09-10 DIAGNOSIS — G1221 Amyotrophic lateral sclerosis: Secondary | ICD-10-CM | POA: Diagnosis not present

## 2018-09-10 DIAGNOSIS — R2689 Other abnormalities of gait and mobility: Secondary | ICD-10-CM | POA: Diagnosis not present

## 2018-09-11 DIAGNOSIS — R488 Other symbolic dysfunctions: Secondary | ICD-10-CM | POA: Diagnosis not present

## 2018-09-11 DIAGNOSIS — G1221 Amyotrophic lateral sclerosis: Secondary | ICD-10-CM | POA: Diagnosis not present

## 2018-09-11 DIAGNOSIS — R2689 Other abnormalities of gait and mobility: Secondary | ICD-10-CM | POA: Diagnosis not present

## 2018-09-12 DIAGNOSIS — R2689 Other abnormalities of gait and mobility: Secondary | ICD-10-CM | POA: Diagnosis not present

## 2018-09-12 DIAGNOSIS — G1221 Amyotrophic lateral sclerosis: Secondary | ICD-10-CM | POA: Diagnosis not present

## 2018-09-12 DIAGNOSIS — R488 Other symbolic dysfunctions: Secondary | ICD-10-CM | POA: Diagnosis not present

## 2018-09-13 DIAGNOSIS — G1221 Amyotrophic lateral sclerosis: Secondary | ICD-10-CM | POA: Diagnosis not present

## 2018-09-13 DIAGNOSIS — R2689 Other abnormalities of gait and mobility: Secondary | ICD-10-CM | POA: Diagnosis not present

## 2018-09-13 DIAGNOSIS — R488 Other symbolic dysfunctions: Secondary | ICD-10-CM | POA: Diagnosis not present

## 2018-09-14 DIAGNOSIS — R488 Other symbolic dysfunctions: Secondary | ICD-10-CM | POA: Diagnosis not present

## 2018-09-14 DIAGNOSIS — G1221 Amyotrophic lateral sclerosis: Secondary | ICD-10-CM | POA: Diagnosis not present

## 2018-09-14 DIAGNOSIS — R2689 Other abnormalities of gait and mobility: Secondary | ICD-10-CM | POA: Diagnosis not present

## 2018-09-17 DIAGNOSIS — R488 Other symbolic dysfunctions: Secondary | ICD-10-CM | POA: Diagnosis not present

## 2018-09-17 DIAGNOSIS — G1221 Amyotrophic lateral sclerosis: Secondary | ICD-10-CM | POA: Diagnosis not present

## 2018-09-17 DIAGNOSIS — R2689 Other abnormalities of gait and mobility: Secondary | ICD-10-CM | POA: Diagnosis not present

## 2018-09-18 DIAGNOSIS — G1221 Amyotrophic lateral sclerosis: Secondary | ICD-10-CM | POA: Diagnosis not present

## 2018-09-19 DIAGNOSIS — G1221 Amyotrophic lateral sclerosis: Secondary | ICD-10-CM | POA: Diagnosis not present

## 2018-09-20 DIAGNOSIS — G1221 Amyotrophic lateral sclerosis: Secondary | ICD-10-CM | POA: Diagnosis not present

## 2018-09-21 DIAGNOSIS — G1221 Amyotrophic lateral sclerosis: Secondary | ICD-10-CM | POA: Diagnosis not present

## 2018-09-24 DIAGNOSIS — G1221 Amyotrophic lateral sclerosis: Secondary | ICD-10-CM | POA: Diagnosis not present

## 2018-09-25 DIAGNOSIS — G1221 Amyotrophic lateral sclerosis: Secondary | ICD-10-CM | POA: Diagnosis not present

## 2018-09-26 DIAGNOSIS — F339 Major depressive disorder, recurrent, unspecified: Secondary | ICD-10-CM | POA: Diagnosis not present

## 2018-09-26 DIAGNOSIS — F482 Pseudobulbar affect: Secondary | ICD-10-CM | POA: Diagnosis not present

## 2018-09-26 DIAGNOSIS — M25562 Pain in left knee: Secondary | ICD-10-CM | POA: Diagnosis not present

## 2018-09-26 DIAGNOSIS — G1221 Amyotrophic lateral sclerosis: Secondary | ICD-10-CM | POA: Diagnosis not present

## 2018-09-27 DIAGNOSIS — G1221 Amyotrophic lateral sclerosis: Secondary | ICD-10-CM | POA: Diagnosis not present

## 2018-09-28 DIAGNOSIS — G1221 Amyotrophic lateral sclerosis: Secondary | ICD-10-CM | POA: Diagnosis not present

## 2018-10-01 DIAGNOSIS — G1221 Amyotrophic lateral sclerosis: Secondary | ICD-10-CM | POA: Diagnosis not present

## 2018-10-02 DIAGNOSIS — G1221 Amyotrophic lateral sclerosis: Secondary | ICD-10-CM | POA: Diagnosis not present

## 2018-10-04 DIAGNOSIS — G1221 Amyotrophic lateral sclerosis: Secondary | ICD-10-CM | POA: Diagnosis not present

## 2018-10-05 DIAGNOSIS — G1221 Amyotrophic lateral sclerosis: Secondary | ICD-10-CM | POA: Diagnosis not present

## 2018-10-08 DIAGNOSIS — G1221 Amyotrophic lateral sclerosis: Secondary | ICD-10-CM | POA: Diagnosis not present

## 2018-10-09 DIAGNOSIS — G1221 Amyotrophic lateral sclerosis: Secondary | ICD-10-CM | POA: Diagnosis not present

## 2018-10-10 DIAGNOSIS — G1221 Amyotrophic lateral sclerosis: Secondary | ICD-10-CM | POA: Diagnosis not present

## 2018-10-11 DIAGNOSIS — G1221 Amyotrophic lateral sclerosis: Secondary | ICD-10-CM | POA: Diagnosis not present

## 2018-10-12 DIAGNOSIS — G1221 Amyotrophic lateral sclerosis: Secondary | ICD-10-CM | POA: Diagnosis not present

## 2018-10-16 DIAGNOSIS — G1221 Amyotrophic lateral sclerosis: Secondary | ICD-10-CM | POA: Diagnosis not present

## 2018-10-19 DIAGNOSIS — G1221 Amyotrophic lateral sclerosis: Secondary | ICD-10-CM | POA: Diagnosis not present

## 2018-11-01 DIAGNOSIS — R399 Unspecified symptoms and signs involving the genitourinary system: Secondary | ICD-10-CM | POA: Diagnosis not present

## 2018-11-01 DIAGNOSIS — Z9989 Dependence on other enabling machines and devices: Secondary | ICD-10-CM | POA: Diagnosis not present

## 2018-11-01 DIAGNOSIS — M62838 Other muscle spasm: Secondary | ICD-10-CM | POA: Diagnosis not present

## 2018-11-01 DIAGNOSIS — N3090 Cystitis, unspecified without hematuria: Secondary | ICD-10-CM | POA: Diagnosis not present

## 2018-11-01 DIAGNOSIS — F339 Major depressive disorder, recurrent, unspecified: Secondary | ICD-10-CM | POA: Diagnosis not present

## 2018-11-08 DIAGNOSIS — N3000 Acute cystitis without hematuria: Secondary | ICD-10-CM | POA: Diagnosis not present

## 2018-11-08 DIAGNOSIS — M62838 Other muscle spasm: Secondary | ICD-10-CM | POA: Diagnosis not present

## 2018-11-08 DIAGNOSIS — F339 Major depressive disorder, recurrent, unspecified: Secondary | ICD-10-CM | POA: Diagnosis not present

## 2018-11-09 DIAGNOSIS — Z03818 Encounter for observation for suspected exposure to other biological agents ruled out: Secondary | ICD-10-CM | POA: Diagnosis not present

## 2018-11-13 DIAGNOSIS — Z03818 Encounter for observation for suspected exposure to other biological agents ruled out: Secondary | ICD-10-CM | POA: Diagnosis not present

## 2018-11-16 DIAGNOSIS — Z20828 Contact with and (suspected) exposure to other viral communicable diseases: Secondary | ICD-10-CM | POA: Diagnosis not present

## 2018-11-16 DIAGNOSIS — Z1159 Encounter for screening for other viral diseases: Secondary | ICD-10-CM | POA: Diagnosis not present

## 2018-11-23 DIAGNOSIS — Z20828 Contact with and (suspected) exposure to other viral communicable diseases: Secondary | ICD-10-CM | POA: Diagnosis not present

## 2018-11-23 DIAGNOSIS — Z1159 Encounter for screening for other viral diseases: Secondary | ICD-10-CM | POA: Diagnosis not present

## 2018-11-27 DIAGNOSIS — E661 Drug-induced obesity: Secondary | ICD-10-CM | POA: Diagnosis not present

## 2018-11-27 DIAGNOSIS — M6281 Muscle weakness (generalized): Secondary | ICD-10-CM | POA: Diagnosis not present

## 2018-11-27 DIAGNOSIS — S0031XA Abrasion of nose, initial encounter: Secondary | ICD-10-CM | POA: Diagnosis not present

## 2018-11-27 DIAGNOSIS — R52 Pain, unspecified: Secondary | ICD-10-CM | POA: Diagnosis not present

## 2018-11-29 DIAGNOSIS — Z20828 Contact with and (suspected) exposure to other viral communicable diseases: Secondary | ICD-10-CM | POA: Diagnosis not present

## 2018-11-29 DIAGNOSIS — Z1159 Encounter for screening for other viral diseases: Secondary | ICD-10-CM | POA: Diagnosis not present

## 2018-11-30 DIAGNOSIS — Z20828 Contact with and (suspected) exposure to other viral communicable diseases: Secondary | ICD-10-CM | POA: Diagnosis not present

## 2018-11-30 DIAGNOSIS — Z1159 Encounter for screening for other viral diseases: Secondary | ICD-10-CM | POA: Diagnosis not present

## 2018-12-04 DIAGNOSIS — Z20828 Contact with and (suspected) exposure to other viral communicable diseases: Secondary | ICD-10-CM | POA: Diagnosis not present

## 2018-12-04 DIAGNOSIS — Z1159 Encounter for screening for other viral diseases: Secondary | ICD-10-CM | POA: Diagnosis not present

## 2018-12-06 DIAGNOSIS — Z978 Presence of other specified devices: Secondary | ICD-10-CM | POA: Diagnosis not present

## 2018-12-06 DIAGNOSIS — Z20828 Contact with and (suspected) exposure to other viral communicable diseases: Secondary | ICD-10-CM | POA: Diagnosis not present

## 2018-12-06 DIAGNOSIS — Z9989 Dependence on other enabling machines and devices: Secondary | ICD-10-CM | POA: Diagnosis not present

## 2018-12-06 DIAGNOSIS — Z931 Gastrostomy status: Secondary | ICD-10-CM | POA: Diagnosis not present

## 2018-12-06 DIAGNOSIS — Z1159 Encounter for screening for other viral diseases: Secondary | ICD-10-CM | POA: Diagnosis not present

## 2018-12-06 DIAGNOSIS — G4733 Obstructive sleep apnea (adult) (pediatric): Secondary | ICD-10-CM | POA: Diagnosis not present

## 2018-12-12 DIAGNOSIS — Z20828 Contact with and (suspected) exposure to other viral communicable diseases: Secondary | ICD-10-CM | POA: Diagnosis not present

## 2018-12-12 DIAGNOSIS — Z1159 Encounter for screening for other viral diseases: Secondary | ICD-10-CM | POA: Diagnosis not present

## 2018-12-18 DIAGNOSIS — Z1159 Encounter for screening for other viral diseases: Secondary | ICD-10-CM | POA: Diagnosis not present

## 2019-01-18 DEATH — deceased

## 2019-05-05 IMAGING — MR MR HEAD WO/W CM
11 of 21 series · 28 of 48 positions shown · IV contrast (Yes)
Comparison: Brain and cervical spine MRI 09/19/2017

CLINICAL DATA: Generalized muscle weakness.

EXAM:
MRI HEAD WITHOUT AND WITH CONTRAST
MRI CERVICAL SPINE WITHOUT AND WITH CONTRAST
TECHNIQUE: Multiplanar, multiecho pulse sequences of the brain and surrounding
structures, and cervical spine, to include the craniocervical
junction and cervicothoracic junction, were obtained without and
with intravenous contrast.
CONTRAST:  7 mL Gadavist

[Series 3: DWI · axial · 3.0mm · 1.09mm/px · z∈[-49,+98]mm · 6 of 100 slices shown (1 of 4)]
[im 1/100]
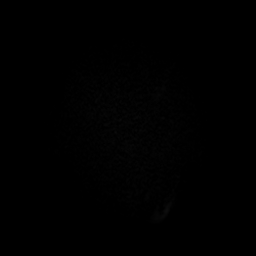
[im 20/100]
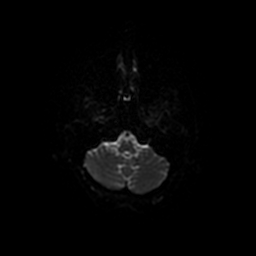
[im 40/100]
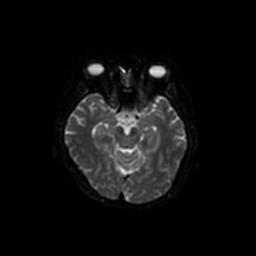
[im 60/100]
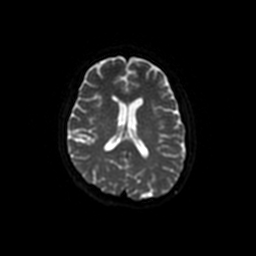
[im 80/100]
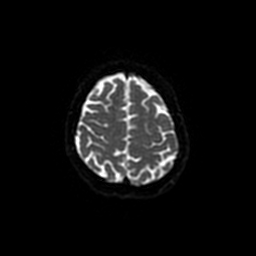
[im 100/100]
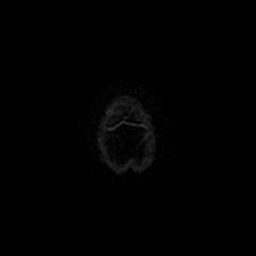

[Series 5: DWI · coronal · 4.0mm · 1.09mm/px · 4 of 70 slices shown (2 of 4)]
[im 1/70]
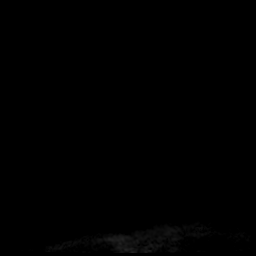
[im 24/70]
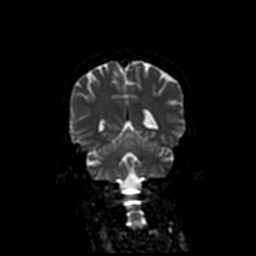
[im 47/70]
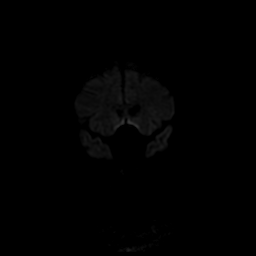
[im 70/70]
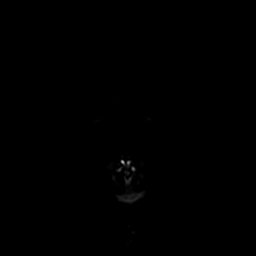

[Series 6: T2 · axial · 5.0mm · 0.43mm/px · z∈[-46,+107]mm · 2 of 23 slices shown (1 of 3)]
[im 1/23]
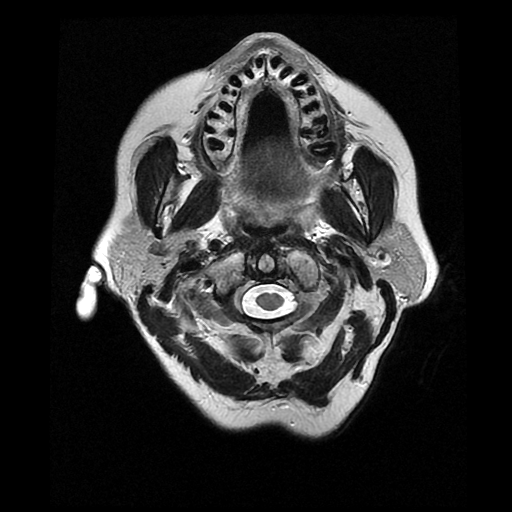
[im 23/23]
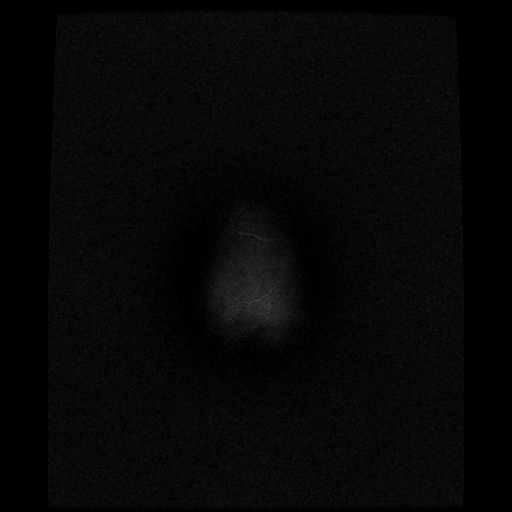

[Series 7: FLAIR · axial · 3.0mm · 0.43mm/px · z∈[-47,+108]mm · 2 of 27 slices shown]
[im 1/27]
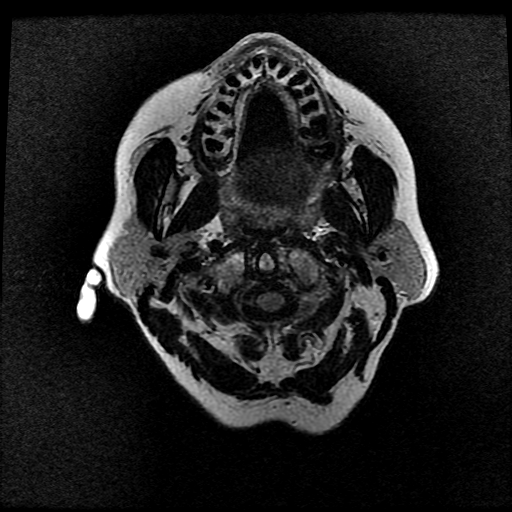
[im 27/27]
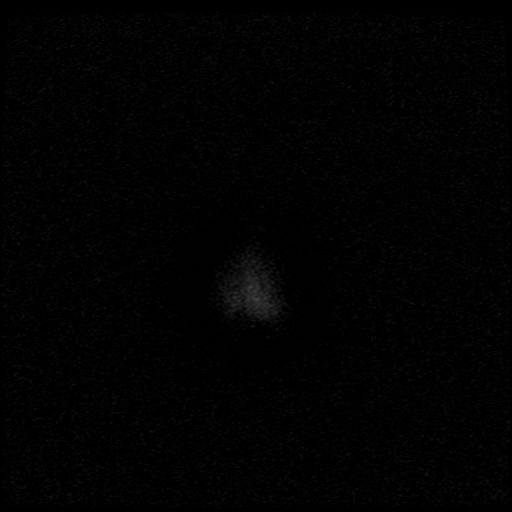

[Series 10: T2 · coronal · 5.0mm · 0.45mm/px · 2 of 28 slices shown (2 of 3)]
[im 1/28]
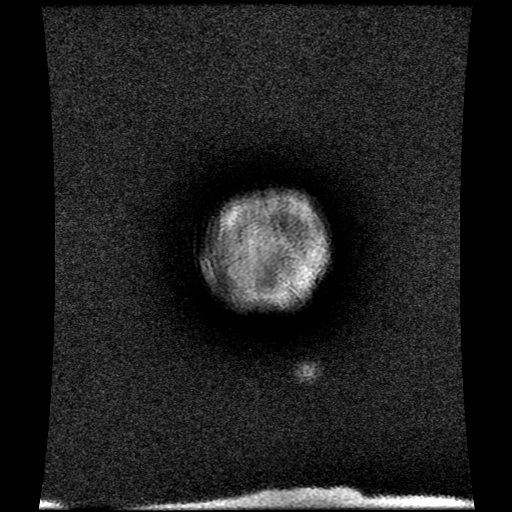
[im 28/28]
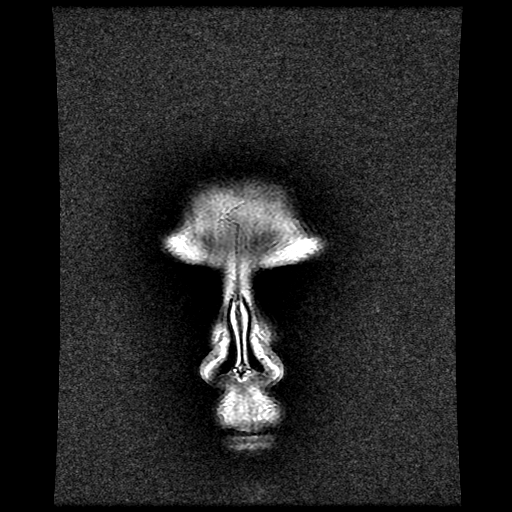

[Series 15: T2 · axial · 3.1mm · 0.35mm/px · 1 of 32 slices shown (3 of 3)]
[im 1/32]
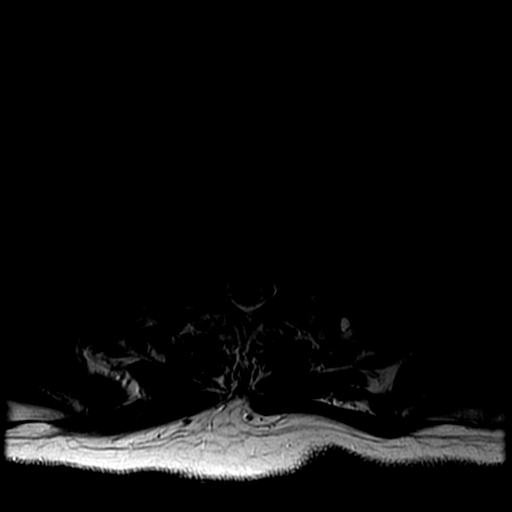

[Series 19: T1 post-contrast · axial · 3.1mm · 0.35mm/px · z∈[-203,-98]mm · 2 of 32 slices shown (1 of 3)]
[im 1/32]
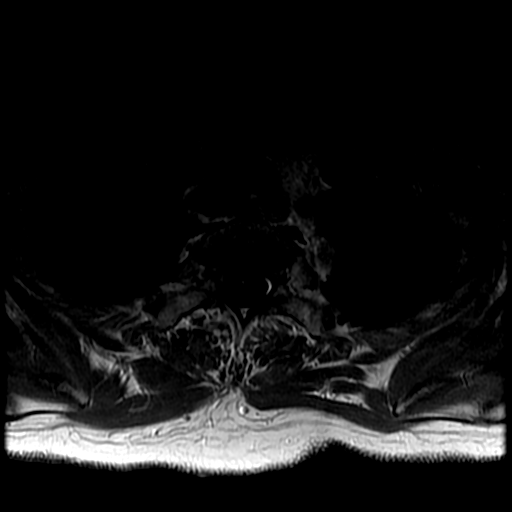
[im 32/32]
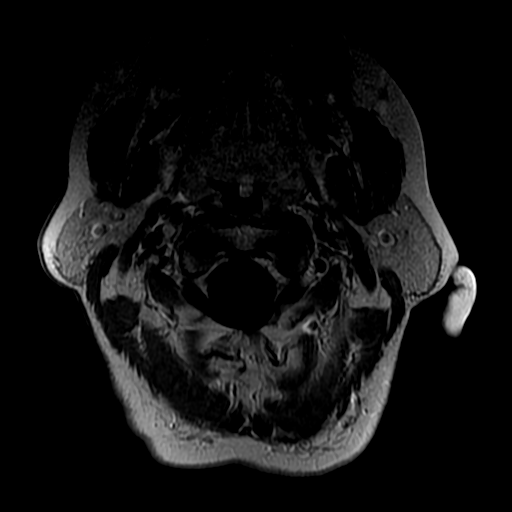

[Series 23: T1 post-contrast · sagittal · 5.0mm · 0.47mm/px · 2 of 24 slices shown (2 of 3)]
[im 1/24]
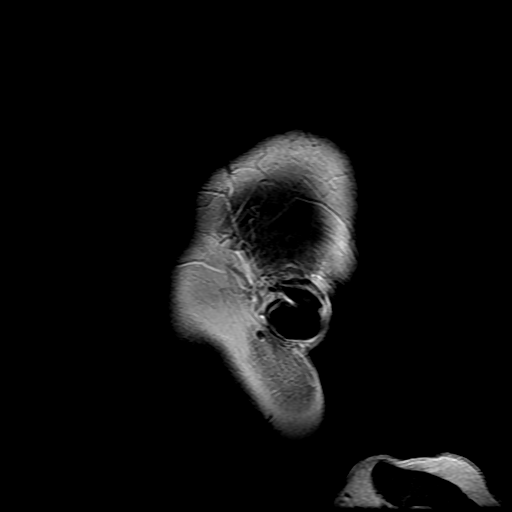
[im 24/24]
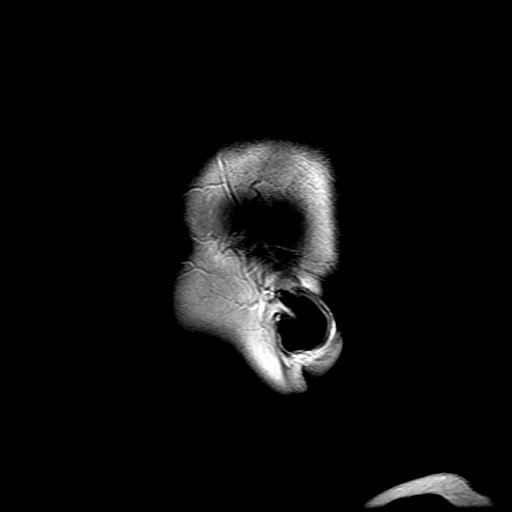

[Series 24: T1 post-contrast · coronal · 5.0mm · 0.45mm/px · 2 of 28 slices shown (3 of 3)]
[im 1/28]
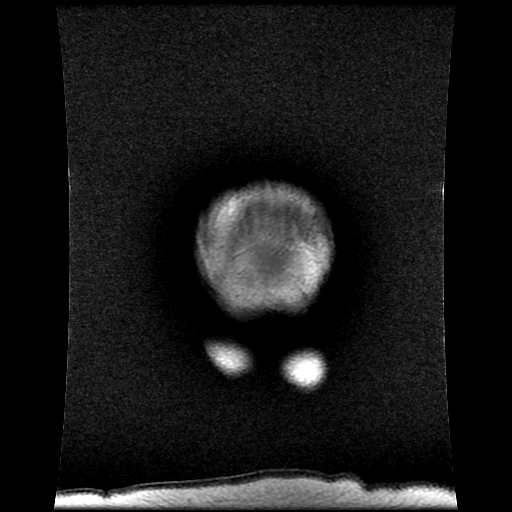
[im 28/28]
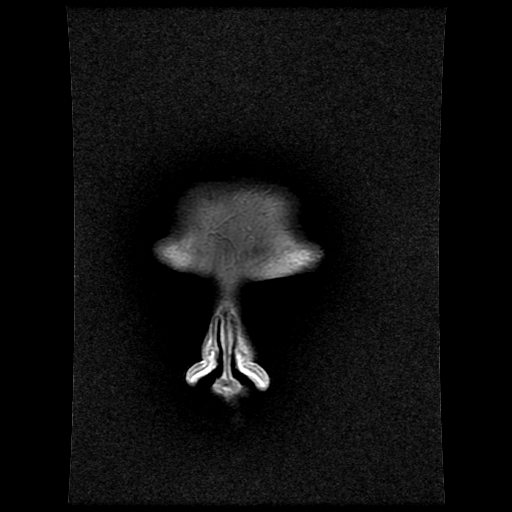

[Series 300: DWI · axial · 3.0mm · 1.09mm/px · z∈[-49,+98]mm · 3 of 50 slices shown (3 of 4)]
[im 1/50]
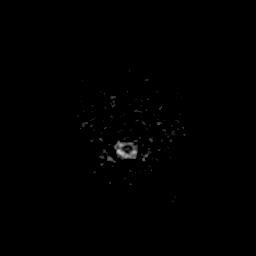
[im 25/50]
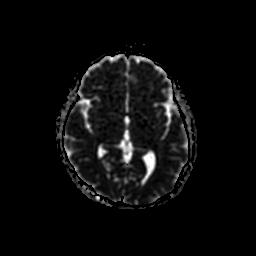
[im 50/50]
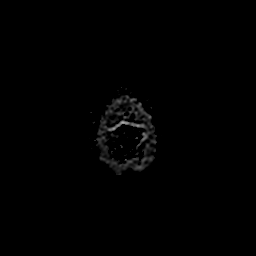

[Series 500: DWI · coronal · 4.0mm · 1.09mm/px · 2 of 34 slices shown (4 of 4)]
[im 1/34]
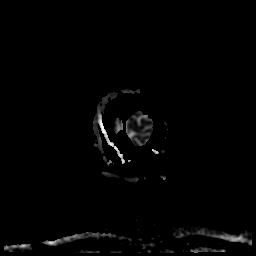
[im 34/34]
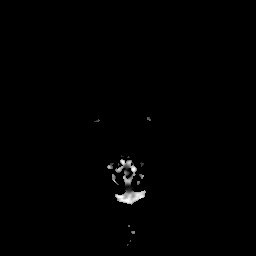

[28 of 48 positions shown; findings below may reference images not displayed]

FINDINGS: MRI HEAD FINDINGS

BRAIN: There is no acute infarct, acute hemorrhage, hydrocephalus or
extra-axial collection. The midline structures are normal. No
midline shift or other mass effect. There are no old infarcts.
Minimal white matter hyperintensity, nonspecific and commonly seen
in asymptomatic patients of this age. The cerebral and cerebellar
volume are age-appropriate. Susceptibility-sensitive sequences show
no chronic microhemorrhage or superficial siderosis. No abnormal
contrast enhancement.

VASCULAR: Major intracranial arterial and venous sinus flow voids
are normal.

SKULL AND UPPER CERVICAL SPINE: Calvarial bone marrow signal is
normal. There is no skull base mass. Visualized upper cervical spine
and soft tissues are normal.

SINUSES/ORBITS: No fluid levels or advanced mucosal thickening. No
mastoid or middle ear effusion. The orbits are normal.

MRI CERVICAL SPINE FINDINGS

Alignment: Grade 1 retrolisthesis of C5 on C6 is unchanged.

Vertebrae: No focal marrow lesion. No compression fracture or
evidence of discitis osteomyelitis.

Cord: Normal caliber and signal.

Posterior Fossa, vertebral arteries, paraspinal tissues: Visualized
posterior fossa is normal. Vertebral artery flow voids are
preserved. No prevertebral effusion.

Disc levels: Sagittal imaging of the C1-T4 disc levels was acquired,
with axial imaging from C2-T1.

C2-C3: Normal.

C3-C4: Left-greater-than-right uncovertebral spurring with mild left
foraminal stenosis.

C4-C5: Unchanged small central disc protrusion and
right-greater-than-left uncovertebral spurring. Mild right foraminal
stenosis.

C5-C6: Intermediate sized disc osteophyte complex, worse on the
left. Severe bilateral foraminal stenosis and mild spinal canal,
unchanged.

C6-C7: No disc herniation or stenosis.

C7-T1: Normal.

T1-T2, T2-T3 and T3-T4 are imaged only in the sagittal plane, but
are normal.
IMPRESSION: 1. Normal MRI of the brain for age.
2. Unchanged MRI of the cervical spine without advanced spinal canal
stenosis or spinal cord abnormality.
3. Unchanged multilevel foraminal stenosis worst at C5-6.

## 2019-05-09 IMAGING — CR DG HIP (WITH OR WITHOUT PELVIS) 2-3V*L*
3 series · 3 of 3 positions shown · non-contrast
Comparison: None.

CLINICAL DATA: Left hip pain.

EXAM:
DG HIP (WITH OR WITHOUT PELVIS) 2-3V LEFT

[pelvis ap]
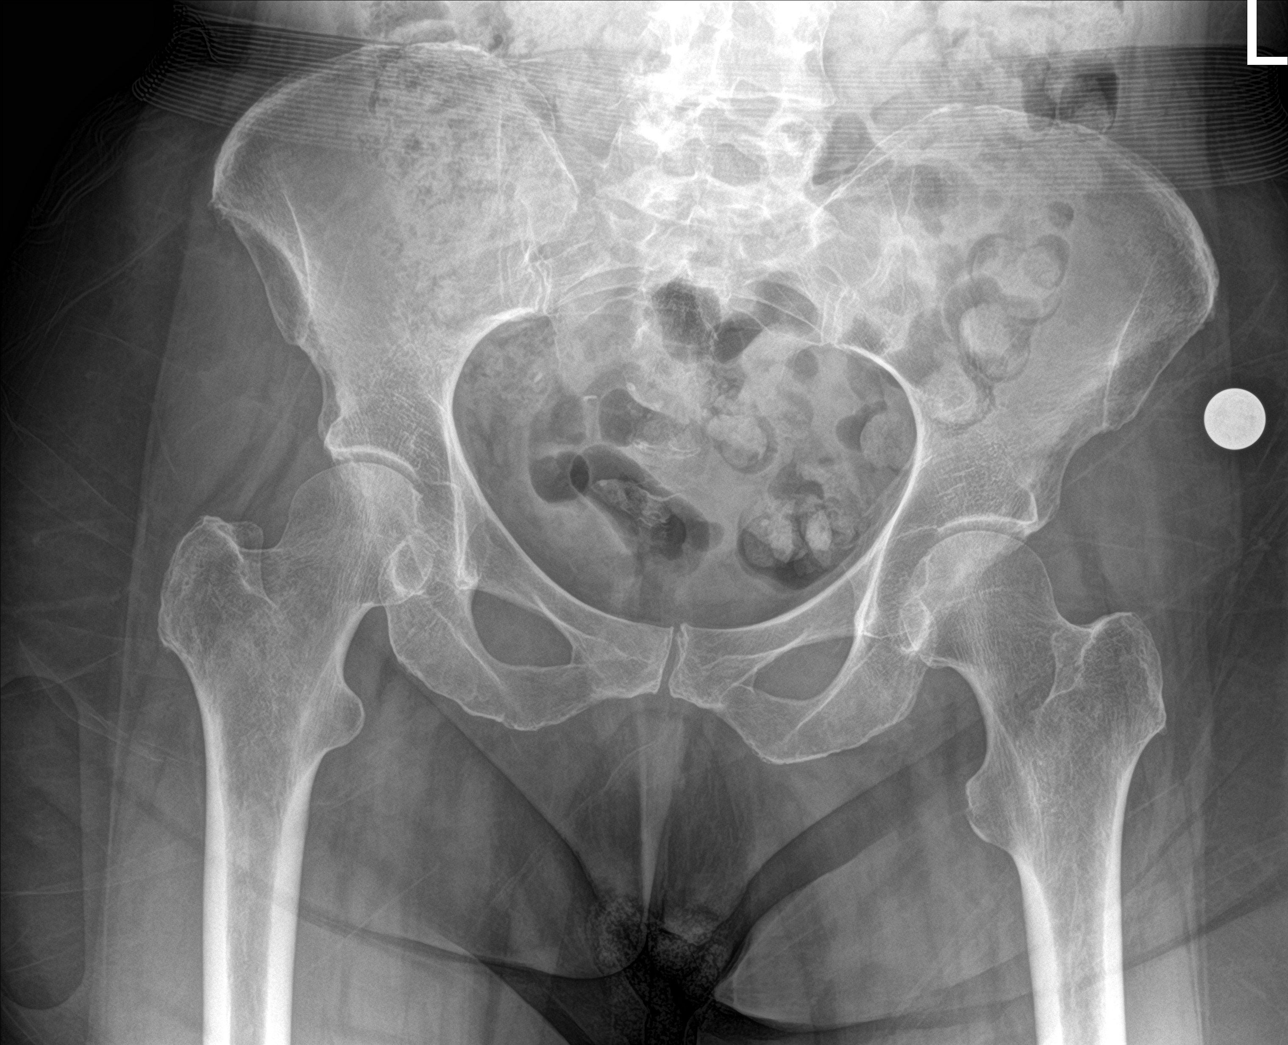

[hip ap]
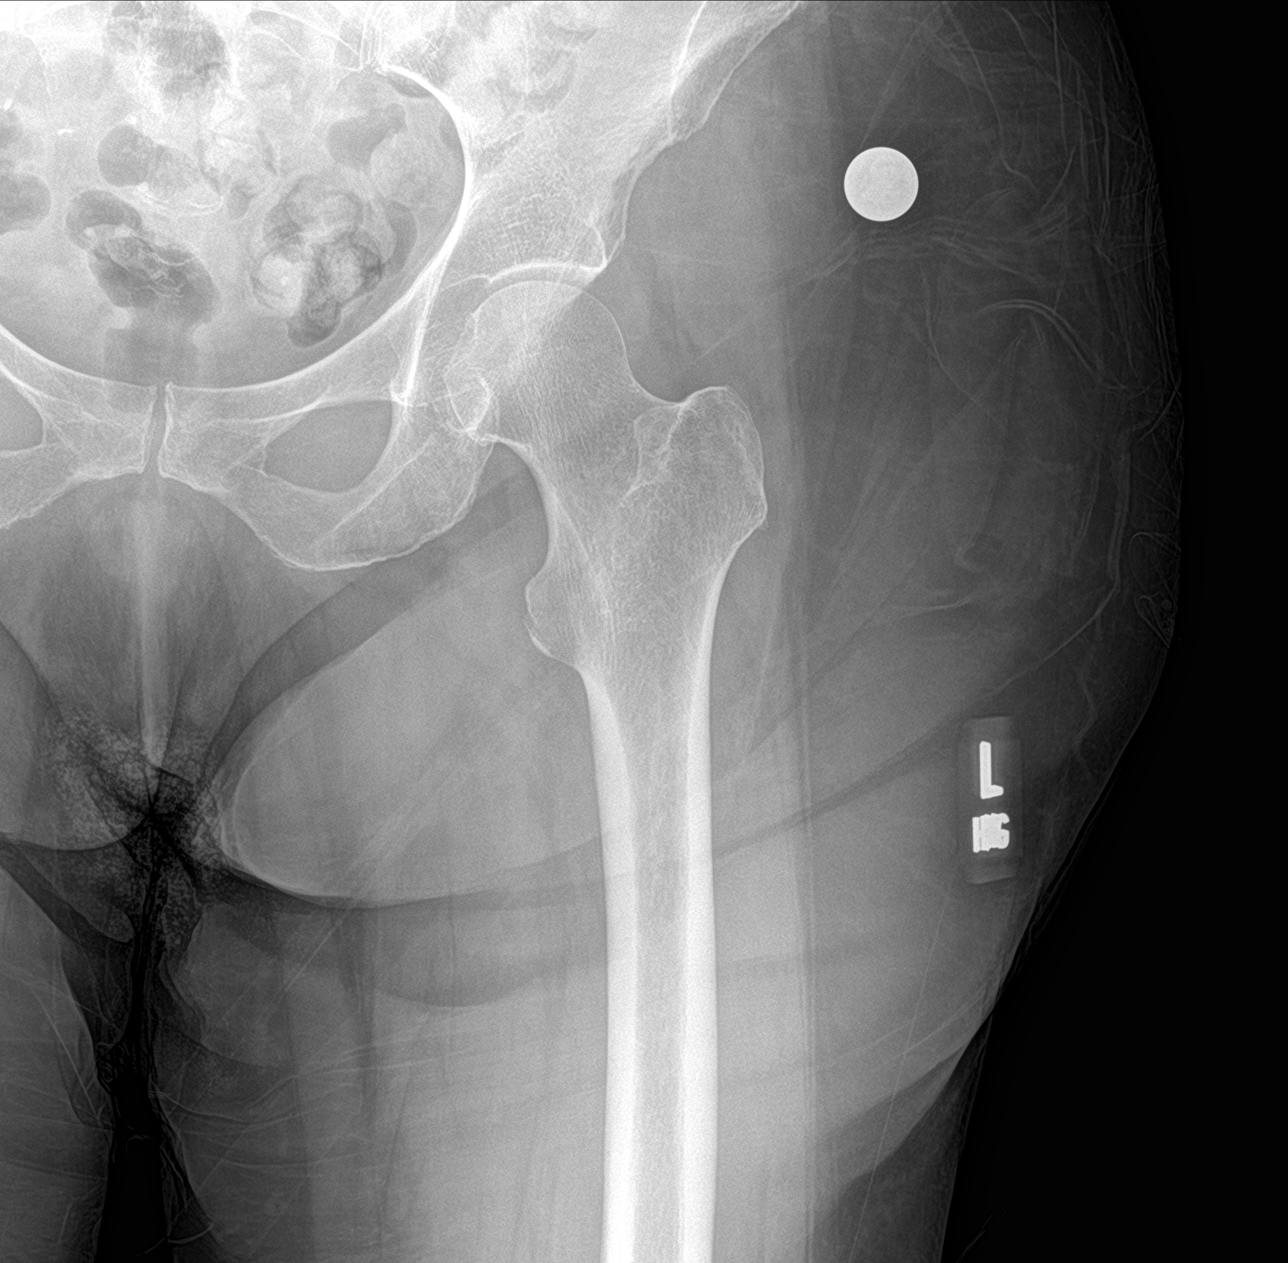

[hip lat]
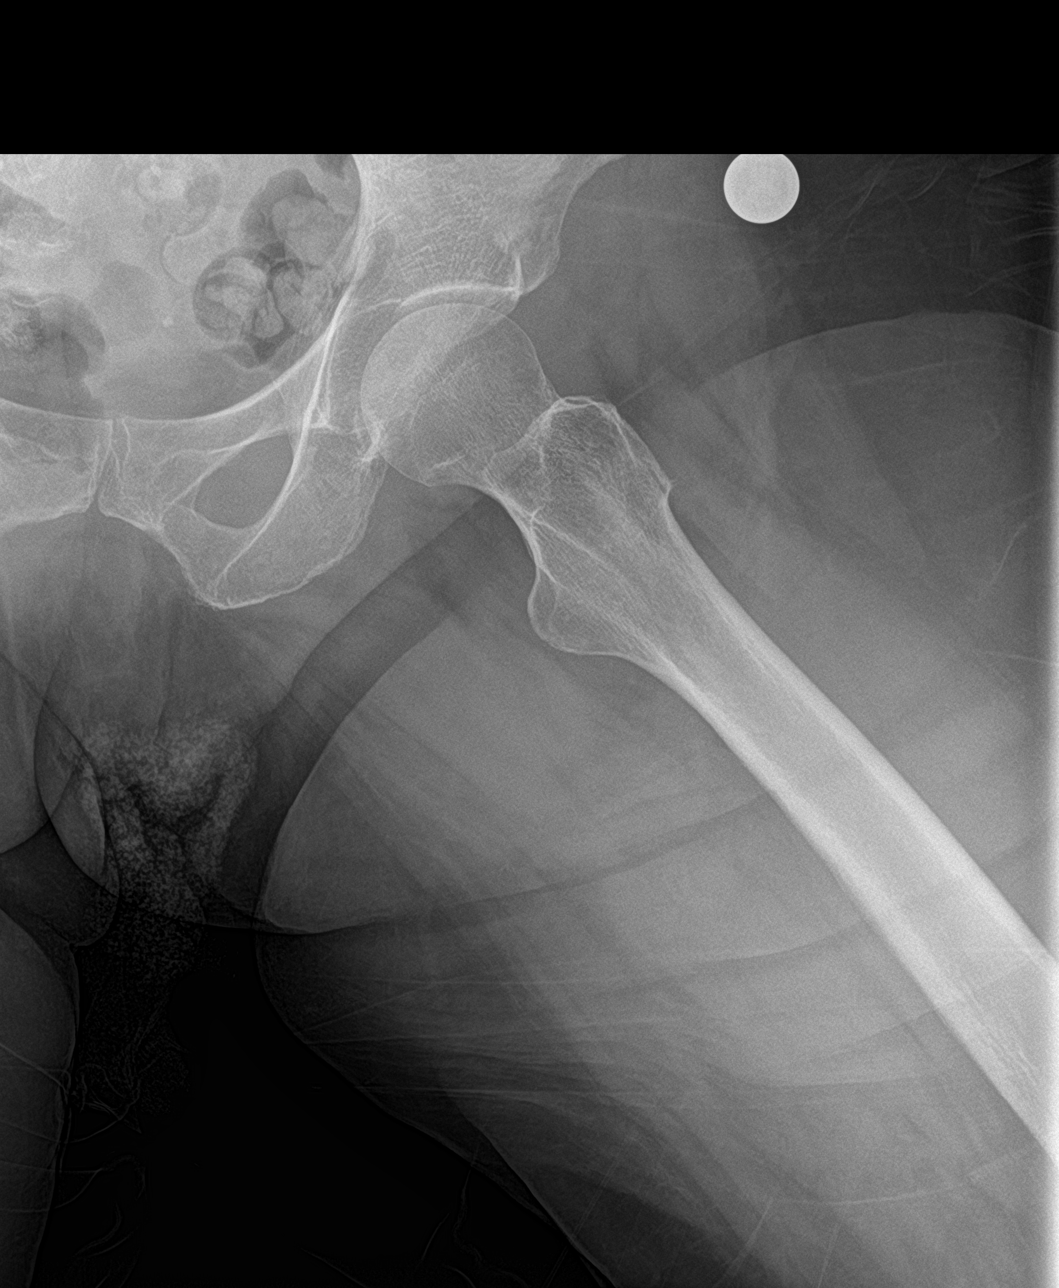

[3 of 3 positions shown; findings below may reference images not displayed]

FINDINGS: The cortical margins of the bony pelvis and left hip are intact. No
fracture. Pubic symphysis and sacroiliac joints are congruent. No
evidence of focal lesion or bony destructive change. Both femoral
heads are well-seated in the respective acetabula.
IMPRESSION: Negative radiographs of the pelvis and left hip.
